# Patient Record
Sex: Female | Born: 1940 | Race: White | Hispanic: No | Smoking: Never smoker
Health system: Southern US, Community
[De-identification: ages and names within clinical notes are randomized; demographics above are authoritative.]

## PROBLEM LIST (undated history)

## (undated) DIAGNOSIS — I10 Essential (primary) hypertension: Secondary | ICD-10-CM

## (undated) DIAGNOSIS — E785 Hyperlipidemia, unspecified: Secondary | ICD-10-CM

## (undated) DIAGNOSIS — K219 Gastro-esophageal reflux disease without esophagitis: Secondary | ICD-10-CM

## (undated) DIAGNOSIS — R296 Repeated falls: Secondary | ICD-10-CM

## (undated) DIAGNOSIS — F32A Depression, unspecified: Secondary | ICD-10-CM

## (undated) DIAGNOSIS — E559 Vitamin D deficiency, unspecified: Secondary | ICD-10-CM

## (undated) DIAGNOSIS — F329 Major depressive disorder, single episode, unspecified: Secondary | ICD-10-CM

## (undated) DIAGNOSIS — W19XXXA Unspecified fall, initial encounter: Secondary | ICD-10-CM

## (undated) DIAGNOSIS — R413 Other amnesia: Secondary | ICD-10-CM

## (undated) DIAGNOSIS — E119 Type 2 diabetes mellitus without complications: Secondary | ICD-10-CM

## (undated) HISTORY — DX: Unspecified fall, initial encounter: W19.XXXA

## (undated) HISTORY — DX: Gastro-esophageal reflux disease without esophagitis: K21.9

## (undated) HISTORY — DX: Type 2 diabetes mellitus without complications: E11.9

## (undated) HISTORY — DX: Hyperlipidemia, unspecified: E78.5

## (undated) HISTORY — DX: Essential (primary) hypertension: I10

## (undated) HISTORY — DX: Vitamin D deficiency, unspecified: E55.9

## (undated) HISTORY — DX: Major depressive disorder, single episode, unspecified: F32.9

## (undated) HISTORY — DX: Depression, unspecified: F32.A

## (undated) HISTORY — DX: Repeated falls: R29.6

## (undated) HISTORY — PX: CHOLECYSTECTOMY: SHX55

## (undated) HISTORY — DX: Other amnesia: R41.3

---

## 1988-07-17 HISTORY — PX: TOTAL ABDOMINAL HYSTERECTOMY W/ BILATERAL SALPINGOOPHORECTOMY: SHX83

## 1997-11-27 ENCOUNTER — Other Ambulatory Visit: Admission: RE | Admit: 1997-11-27 | Discharge: 1997-11-27 | Payer: Self-pay | Admitting: *Deleted

## 1998-03-11 ENCOUNTER — Ambulatory Visit (HOSPITAL_COMMUNITY): Admission: RE | Admit: 1998-03-11 | Discharge: 1998-03-11 | Payer: Self-pay | Admitting: *Deleted

## 1998-04-20 ENCOUNTER — Encounter: Payer: Self-pay | Admitting: General Surgery

## 1998-04-20 ENCOUNTER — Ambulatory Visit (HOSPITAL_COMMUNITY): Admission: RE | Admit: 1998-04-20 | Discharge: 1998-04-21 | Payer: Self-pay | Admitting: General Surgery

## 1998-12-28 ENCOUNTER — Other Ambulatory Visit: Admission: RE | Admit: 1998-12-28 | Discharge: 1998-12-28 | Payer: Self-pay | Admitting: *Deleted

## 1999-04-06 ENCOUNTER — Ambulatory Visit (HOSPITAL_COMMUNITY): Admission: RE | Admit: 1999-04-06 | Discharge: 1999-04-06 | Payer: Self-pay | Admitting: *Deleted

## 2000-01-23 ENCOUNTER — Other Ambulatory Visit: Admission: RE | Admit: 2000-01-23 | Discharge: 2000-01-23 | Payer: Self-pay | Admitting: *Deleted

## 2000-06-26 ENCOUNTER — Other Ambulatory Visit: Admission: RE | Admit: 2000-06-26 | Discharge: 2000-06-26 | Payer: Self-pay | Admitting: Plastic Surgery

## 2000-06-26 ENCOUNTER — Encounter (INDEPENDENT_AMBULATORY_CARE_PROVIDER_SITE_OTHER): Payer: Self-pay

## 2002-03-06 ENCOUNTER — Encounter: Payer: Self-pay | Admitting: Internal Medicine

## 2002-03-06 ENCOUNTER — Ambulatory Visit (HOSPITAL_COMMUNITY): Admission: RE | Admit: 2002-03-06 | Discharge: 2002-03-06 | Payer: Self-pay | Admitting: Internal Medicine

## 2002-03-13 ENCOUNTER — Encounter: Payer: Self-pay | Admitting: Internal Medicine

## 2002-03-13 ENCOUNTER — Ambulatory Visit (HOSPITAL_COMMUNITY): Admission: RE | Admit: 2002-03-13 | Discharge: 2002-03-13 | Payer: Self-pay | Admitting: Internal Medicine

## 2002-11-13 ENCOUNTER — Ambulatory Visit (HOSPITAL_COMMUNITY): Admission: RE | Admit: 2002-11-13 | Discharge: 2002-11-13 | Payer: Self-pay | Admitting: Internal Medicine

## 2002-11-13 ENCOUNTER — Encounter: Payer: Self-pay | Admitting: Internal Medicine

## 2003-03-26 ENCOUNTER — Encounter: Admission: RE | Admit: 2003-03-26 | Discharge: 2003-06-24 | Payer: Self-pay | Admitting: Internal Medicine

## 2004-03-31 ENCOUNTER — Ambulatory Visit (HOSPITAL_COMMUNITY): Admission: RE | Admit: 2004-03-31 | Discharge: 2004-03-31 | Payer: Self-pay | Admitting: Internal Medicine

## 2005-04-04 ENCOUNTER — Other Ambulatory Visit: Admission: RE | Admit: 2005-04-04 | Discharge: 2005-04-04 | Payer: Self-pay | Admitting: Internal Medicine

## 2005-04-07 ENCOUNTER — Ambulatory Visit (HOSPITAL_COMMUNITY): Admission: RE | Admit: 2005-04-07 | Discharge: 2005-04-07 | Payer: Self-pay | Admitting: Internal Medicine

## 2005-11-15 ENCOUNTER — Encounter: Payer: Self-pay | Admitting: General Surgery

## 2006-04-11 ENCOUNTER — Ambulatory Visit (HOSPITAL_COMMUNITY): Admission: RE | Admit: 2006-04-11 | Discharge: 2006-04-11 | Payer: Self-pay | Admitting: Internal Medicine

## 2007-05-08 ENCOUNTER — Ambulatory Visit (HOSPITAL_COMMUNITY): Admission: RE | Admit: 2007-05-08 | Discharge: 2007-05-08 | Payer: Self-pay | Admitting: Internal Medicine

## 2008-06-09 ENCOUNTER — Ambulatory Visit (HOSPITAL_COMMUNITY): Admission: RE | Admit: 2008-06-09 | Discharge: 2008-06-09 | Payer: Self-pay | Admitting: Internal Medicine

## 2008-11-19 ENCOUNTER — Ambulatory Visit (HOSPITAL_COMMUNITY): Admission: RE | Admit: 2008-11-19 | Discharge: 2008-11-19 | Payer: Self-pay | Admitting: Internal Medicine

## 2009-01-04 ENCOUNTER — Inpatient Hospital Stay (HOSPITAL_COMMUNITY): Admission: EM | Admit: 2009-01-04 | Discharge: 2009-01-05 | Payer: Self-pay | Admitting: Emergency Medicine

## 2009-01-04 ENCOUNTER — Ambulatory Visit: Payer: Self-pay | Admitting: *Deleted

## 2009-03-29 ENCOUNTER — Encounter (INDEPENDENT_AMBULATORY_CARE_PROVIDER_SITE_OTHER): Payer: Self-pay | Admitting: *Deleted

## 2009-06-02 ENCOUNTER — Other Ambulatory Visit: Admission: RE | Admit: 2009-06-02 | Discharge: 2009-06-02 | Payer: Self-pay | Admitting: Internal Medicine

## 2009-06-02 LAB — HM PAP SMEAR: HM Pap smear: NORMAL

## 2009-06-16 ENCOUNTER — Ambulatory Visit (HOSPITAL_COMMUNITY): Admission: RE | Admit: 2009-06-16 | Discharge: 2009-06-16 | Payer: Self-pay | Admitting: Internal Medicine

## 2009-07-07 ENCOUNTER — Encounter (INDEPENDENT_AMBULATORY_CARE_PROVIDER_SITE_OTHER): Payer: Self-pay | Admitting: *Deleted

## 2009-07-12 ENCOUNTER — Ambulatory Visit: Payer: Self-pay | Admitting: Internal Medicine

## 2009-07-23 ENCOUNTER — Ambulatory Visit: Payer: Self-pay | Admitting: Internal Medicine

## 2009-07-23 LAB — HM COLONOSCOPY: HM Colonoscopy: NORMAL

## 2010-08-07 ENCOUNTER — Encounter: Payer: Self-pay | Admitting: Internal Medicine

## 2010-08-18 NOTE — Procedures (Signed)
Summary: Colonoscopy  Patient: Melissa Davis Note: All result statuses are Final unless otherwise noted.  Tests: (1) Colonoscopy (COL)   COL Colonoscopy           DONE     [facility]     [address]     [place], [place]  [address]           COLONOSCOPY PROCEDURE REPORT           PATIENT:  Melissa Davis, Melissa Davis  MR#:  [id number]     BIRTHDATE:  04-Oct-1940, 68 yrs. old  GENDER:  female           ENDOSCOPIST:  Hedwig Morton. Juanda Chance, MD     Referred by:  Marisue Brooklyn, D.O.           PROCEDURE DATE:  07/23/2009     PROCEDURE:  Colonoscopy 25956     ASA CLASS:  Class I     INDICATIONS:  Routine Risk Screening last colon 2000 was normal           MEDICATIONS:   Versed 7 mg, Fentanyl 50 mcg           DESCRIPTION OF PROCEDURE:   After the risks benefits and     alternatives of the procedure were thoroughly explained, informed     consent was obtained.  Digital rectal exam was performed and     revealed no rectal masses.   The LB CF-[id number] endoscope     was introduced through the anus and advanced to the cecum, which     was identified by both the appendix and ileocecal valve, without     limitations.  The quality of the prep was good, using MiraLax.     The instrument was then slowly withdrawn as the colon was fully     examined.     <<PROCEDUREIMAGES>>           FINDINGS:  No polyps or cancers were seen (see image1, image2,     image3, image4, and image5).   Retroflexed views in the rectum     revealed no abnormalities.    The scope was then withdrawn from     the patient and the procedure completed.           COMPLICATIONS:  None           ENDOSCOPIC IMPRESSION:     1) No polyps or cancers     2) Normal colonoscopy     RECOMMENDATIONS:     1) high fiber diet           REPEAT EXAM:  In 10 year(s) for.           ______________________________     Hedwig Morton. Juanda Chance, MD           CC:           n.     eSIGNED:   Hedwig Morton. Bekim Werntz at 07/23/2009 10:15 AM           Melissa Davis, [id number]  Note: An exclamation mark (!) indicates a result that was not dispersed into the flowsheet. Document Creation Date: 07/23/2009 10:16 AM _______________________________________________________________________  (1) Order result status: Final Collection or observation date-time: 07/23/2009 10:11 Requested date-time:  Receipt date-time:  Reported date-time:  Referring Physician:   Ordering Physician: Lina Sar [phone]) Specimen Source:  Source: Launa Grill Order Number: [id number] Lab site:   Appended Document: Colonoscopy    Clinical Lists Changes  Observations: Added new observation of COLONNXTDUE: 07/2019 (07/23/2009 13:07)

## 2010-10-02 LAB — GLUCOSE, CAPILLARY

## 2010-10-24 LAB — CBC
HCT: 42.2 % (ref 36.0–46.0)
Hemoglobin: 14.3 g/dL (ref 12.0–15.0)
MCHC: 33.9 g/dL (ref 30.0–36.0)
MCHC: 33.9 g/dL (ref 30.0–36.0)
MCV: 90.5 fL (ref 78.0–100.0)
Platelets: 201 10*3/uL (ref 150–400)
Platelets: 248 10*3/uL (ref 150–400)
RBC: 4.66 MIL/uL (ref 3.87–5.11)
RDW: 13 % (ref 11.5–15.5)
RDW: 13 % (ref 11.5–15.5)
WBC: 7.4 10*3/uL (ref 4.0–10.5)

## 2010-10-24 LAB — URINALYSIS, MICROSCOPIC ONLY
Bilirubin Urine: NEGATIVE
Ketones, ur: NEGATIVE mg/dL
Protein, ur: NEGATIVE mg/dL
Specific Gravity, Urine: 1.008 (ref 1.005–1.030)
Urobilinogen, UA: 0.2 mg/dL (ref 0.0–1.0)
pH: 7.5 (ref 5.0–8.0)

## 2010-10-24 LAB — DIFFERENTIAL
Basophils Absolute: 0 10*3/uL (ref 0.0–0.1)
Basophils Relative: 0 % (ref 0–1)
Eosinophils Absolute: 0.1 10*3/uL (ref 0.0–0.7)
Eosinophils Relative: 1 % (ref 0–5)
Lymphocytes Relative: 17 % (ref 12–46)
Monocytes Relative: 4 % (ref 3–12)

## 2010-10-24 LAB — LIPID PANEL
HDL: 42 mg/dL (ref >39)
LDL Cholesterol: 86 mg/dL (ref 0–99)
VLDL: 17 mg/dL (ref 0–40)

## 2010-10-24 LAB — BASIC METABOLIC PANEL
BUN: 11 mg/dL (ref 6–23)
BUN: 16 mg/dL (ref 6–23)
CO2: 24 mEq/L (ref 19–32)
Calcium: 8.4 mg/dL (ref 8.4–10.5)
Creatinine, Ser: 0.68 mg/dL (ref 0.4–1.2)
GFR calc non Af Amer: >60 mL/min (ref >60)
Glucose, Bld: 140 mg/dL — ABNORMAL HIGH (ref 70–99)
Potassium: 3.3 mEq/L — ABNORMAL LOW (ref 3.5–5.1)

## 2010-10-24 LAB — CARDIAC PANEL(CRET KIN+CKTOT+MB+TROPI)
CK, MB: 1.1 ng/mL (ref 0.3–4.0)
Relative Index: 1 (ref 0.0–2.5)
Relative Index: INVALID (ref 0.0–2.5)
Total CK: 115 U/L (ref 7–177)
Troponin I: 0.01 ng/mL (ref 0.00–0.06)
Troponin I: 0.01 ng/mL (ref 0.00–0.06)

## 2010-10-24 LAB — PROTIME-INR
INR: 1 (ref 0.00–1.49)
Prothrombin Time: 13.7 seconds (ref 11.6–15.2)

## 2010-10-24 LAB — GLUCOSE, CAPILLARY
Glucose-Capillary: 113 mg/dL — ABNORMAL HIGH (ref 70–99)
Glucose-Capillary: 121 mg/dL — ABNORMAL HIGH (ref 70–99)
Glucose-Capillary: 122 mg/dL — ABNORMAL HIGH (ref 70–99)

## 2010-10-24 LAB — POCT CARDIAC MARKERS: CKMB, poc: <1 ng/mL — ABNORMAL LOW (ref 1.0–8.0)

## 2010-10-24 LAB — HEMOGLOBIN A1C: Hgb A1c MFr Bld: 6.4 % — ABNORMAL HIGH (ref 4.6–6.1)

## 2010-10-24 LAB — HEPARIN LEVEL (UNFRACTIONATED): Heparin Unfractionated: 0.44 IU/mL (ref 0.30–0.70)

## 2010-11-29 NOTE — Cardiovascular Report (Signed)
Melissa Davis, Melissa Davis                  ACCOUNT NO.:  [id number]   MEDICAL RECORD NO.:  [id number]          PATIENT TYPE:  INP   LOCATION:  4734                         FACILITY:  [facility]   PHYSICIAN:  Corky Crafts, MDDATE OF BIRTH:  1940/10/04   DATE OF PROCEDURE:  01/04/2009  DATE OF DISCHARGE:                            CARDIAC CATHETERIZATION   PRIMARY CARE PHYSICIAN:  Lovenia Kim, DO   PROCEDURES PERFORMED:  Left heart catheterization, left ventriculogram,  coronary angiogram, abdominal aortogram.   OPERATOR:  Corky Crafts, MD   INDICATIONS:  Unstable angina, diabetes.   PROCEDURE NARRATIVE:  The risks and benefits of cardiac catheterization  were explained to the patient and informed consent was obtained.  She  was brought to the cath lab.  She was prepped and draped in the usual  sterile fashion.  Her right groin was infiltrated with 0.25% lidocaine.  A 6-French sheath was placed to the right femoral artery using modified  Seldinger technique.  Left coronary artery angiography was performed  using a JR-4.0 catheter.  The catheter was advanced to the vessel ostium  under fluoroscopic guidance.  Digital angiography was performed in  multiple projections using hand injection of contrast.  Right coronary  artery angiography was performed using a JR-4.0 catheter.  The catheter  was advanced to the vessel ostium under fluoroscopic guidance.  Digital  angiography was performed in multiple projections using hand injection  of contrast.  A pigtail catheter was advanced to the ascending aorta and  across the aortic valve under fluoroscopic guidance.  A power injection  of contrast was performed in the RAO projection to image the left  ventricle.  Catheter was pulled back under continuous hemodynamic  pressure monitoring to the abdominal aorta.  Power injection of contrast  was performed in the AP projection.  The catheter was removed.  A right  femoral arteriogram was  performed in the RAO and LAO views.  The sheath  will be removed using manual compression.   FINDINGS:  The left main is widely patent and angiographically normal.   Left circumflex is a large vessel and angiographically normal.  The OM-1  was small, the OM-2 was a large branching vessel and appeared  angiographically normal, OM-3 was small and widely patent.   The left anterior descending was a large vessel and angiographically  normal.  There is a small patent first diagonal.   The right coronary artery was a medium-sized dominant vessel with mild  irregularities in the proximal to mid vessel.  There was a medium-sized  PDA and PLA both of which were widely patent.   Left ventriculogram showed normal left ventricular ejection fraction of  60%.  There were no regional wall motion abnormalities.   HEMODYNAMICS:  Left ventricular pressure 127/6 with an LVEDP of 12 mmHg.  Aortic pressure of 126/56.  The mean aortic pressure of 86 mmHg.  The  abdominal aortogram showed no abdominal aortic aneurysm.  There were  bilateral single renal arteries both of which were widely patent.   The right femoral arteriogram showed question of a  small intimal  disruption at the entry site.  This could also be in a small area of  calcification.  There is normal flow through the femoral artery.  The  decision was made not to apply closure device.   IMPRESSION:  1. No significant coronary artery disease.  2. Normal left ventricular function.  3. Normal hemodynamics.   RECOMMENDATIONS:  Continue preventive therapy, discontinue heparin and  nitroglycerin, and we will watch her overnight and plan on sending her  home tomorrow.      Corky Crafts, MD  Electronically Signed     JSV/MEDQ  D:  01/04/2009  T:  01/05/2009  Job:  [id number]

## 2010-11-29 NOTE — Discharge Summary (Signed)
NAMESIREN, Melissa Davis                  ACCOUNT NO.:  [id number]   MEDICAL RECORD NO.:  [id number]          PATIENT TYPE:  INP   LOCATION:  4734                         FACILITY:  [facility]   PHYSICIAN:  Corky Crafts, MDDATE OF BIRTH:  1940/10/20   DATE OF ADMISSION:  01/04/2009  DATE OF DISCHARGE:  01/05/2009                               DISCHARGE SUMMARY   DISCHARGE DIAGNOSES:  1. Chest pain.  Cardiac catheterization showed an geographically      patent arteries with normal ejection fraction.  2. Hypertension.  3. Diabetes mellitus.  4. Hyperlipidemia.  5. Status post hysterectomy.  6. Status post cholecystectomy.  7. Long-term medication use.   [name] is a 70 year old female who wake from sleep around 3:00 p.m.  on the day of admission with substernal chest pain.  She said it hurts,  worsens when she takes breath.  In the emergency room, she got some  nitroglycerin, which seemed to help.  Her cardiac enzymes remained  negative; however, her story was very concerning for underlying heart  disease; therefore, she underwent a heart catheterization which showed  angiographically normal coronary arteries.  Her EF was 60% with no wall  motion abnormalities.  Now, abdominal aortogram showed no abdominal  aortic aneurysm, bilateral single renals that were widely patent.  There  was at the right femoral artery a questionable small intimal disruption  at the entry site, but the flow was normal.  Dr. Eldridge Dace then  recommended preventive treatment.  She remained in the hospital  overnight and was stable for discharge to home the following day.   On discharge, her sodium was 140, potassium 4.9, BUN 11, creatinine  0.68.  Initially, her hemoglobin was 14.3 with a hematocrit of 42.2.  As  I am dictating, her last CBC today showed a hemoglobin had dropped to  11.9 with a crit of 35.0.  We are rechecking this because it seems like  a big discrepancy and we are hopeful that this is lab  error.  I will not  let her go home unless I am convinced that her hemoglobin has not  dropped significantly.  Other lab work includes a cholesterol 145,  triglycerides 83, HDL 42, LDL 86.  Urinalysis negative.  Magnesium is  1.8.  Her chest x-ray shows mild cardiomegaly with no acute  cardiopulmonary disease.   DISCHARGE MEDICATIONS:  1. We added an aspirin 81 mg 1 p.o. daily.  2. Atenolol 100 mg daily.  3. Simvastatin 20 mg nightly.  4. Accupril 40 mg a day.  5. Amlodipine 10 mg a day.  6. Clorazepate as prior to admission.  7. Eyedrops as prior to admission.  8. She is to restart Januvia on January 07, 2009.   Remain on a low-sodium, heart-healthy, diabetic diet.  Clean cath site  gently with soap and water.  No scrubbing.  Increase activity slowly.  No lifting over 10 pounds for 1 week.  No driving for 2 days.   Follow up with Dr. Eldridge Dace on January 19, 2009, at 9:00 a.m.   She is discharged to home  in stable but improved condition.      Guy Franco, P.A.      Corky Crafts, MD  Electronically Signed    LB/MEDQ  D:  01/05/2009  T:  01/06/2009  Job:  [id number]   cc:   Lovenia Kim, D.O.

## 2010-11-29 NOTE — H&P (Signed)
Melissa Davis, Melissa Davis                  ACCOUNT NO.:  [id number]   MEDICAL RECORD NO.:  [id number]          PATIENT TYPE:  INP   LOCATION:  2314                         FACILITY:  [facility]   PHYSICIAN:  Unice Cobble, MD     DATE OF BIRTH:  14-Nov-1940   DATE OF ADMISSION:  01/04/2009  DATE OF DISCHARGE:                              HISTORY & PHYSICAL   CHIEF COMPLAINT:  Chest pain.   HISTORY OF PRESENT ILLNESS:  This is a 70 year old white female who  awoke from sleep at 3:00 p.m. with chest pain.  She describes the chest  pain is a pressure in her central chest without radiation.  There is no  diaphoresis, shortness of breath, nausea, presyncope, or palpitations.  It hurts worse when she takes a deep breath.  The pain was 5/10 when she  awoke.  She received three sublingual nitroglycerin in the emergency  department, as well as 1 inch of nitroglycerin paste which helped a bit  and now her pain is down to 2/10.  She denies orthopnea, PND, or lower  extremity edema.   PAST MEDICAL HISTORY:  1. Hypertension.  2. Diabetes.  3. Hyperlipidemia.  4. Status post hysterectomy.  5. Status post cholecystectomy.   ALLERGIES:  NO KNOWN DRUG ALLERGIES.   MEDICATIONS:  1. Accupril 40 mg daily.  2. Amlodipine 10 mg daily.  3. Atenolol 100 mg daily.  4. Clorazepate dipotassium 7.5 mg p.r.n.  5. Januvia 50 mg daily.  6. Simvastatin 20 mg nightly.  7. Travatan ophthalmic eye drops 1 drop each eye nightly.   SOCIAL HISTORY:  She lives alone.  She is retired.  She has never  smoked, drank or used drugs.   FAMILY HISTORY:  Her mother had a heart attack at the age of 75.  Her  father did not have coronary artery disease.   REVIEW OF SYSTEMS:  A complete review of systems done and found to be  otherwise negative except as stated in the HPI.   PHYSICAL EXAMINATION:  VITAL SIGNS:  Temperature is 98, pulse of 83,  respiratory rate 21, blood pressure 155/58 with O2 saturations of 98% on  2 liters.  GENERAL:  She is in no acute distress.  HEENT:  Shows PERRLA, EOMI, MMM.  Oropharynx without erythema or  exudates.  NECK:  Supple without lymphadenopathy, thyromegaly, bruits or  jugulovenous distention.  HEART:  Regular rate and rhythm with normal S1-S2.  No murmurs, gallops  or rubs.  Pulses 2+ and equal bilaterally without bruits.  LUNGS:  Clear to auscultation bilaterally.  ABDOMEN:  Soft and nontender with normal bowel sounds.  No rebound or  guarding.  EXTREMITIES:  Show no cyanosis, clubbing or edema.  MUSCULOSKELETAL:  Shows no joint deformity, effusions, spine or CVA  tenderness.  NEUROLOGICAL:  She is alert and oriented x3 with cranial nerves II-XII  grossly intact.  Strength is 5/5 in all extremities and axial groups  with normal sensation throughout.   LAB AND RADIOLOGY REVIEW:  Chest x-ray shows stable cardiomegaly without  cardiopulmonary disease.  EKG shows a  rate of 80 in normal sinus rhythm.  She has ST-segment depression of up to 1/2 mm inferolaterally.  Her  white count is 13.3 with a left shift.  Potassium is 3.3 with a  creatinine of 0.8, glucose is 164.  CK-MB and troponin are negative.   ASSESSMENT/PLAN:  This is a 70 year old white female with atypical chest  pain with multiple risk factors.  Her EKG is concerning inferolaterally,  although her troponin is negative.  I will bring her in for rule  out/unstable angina with serial troponins and ECGs.  She will receive a  stress test versus a catheterization based on the above results.  Her  home medications will be continued.  She does have an elevated white  blood cell count for unclear reasons.  I will check urinalysis as a  matter of course.      Unice Cobble, MD  Electronically Signed     ACJ/MEDQ  D:  01/04/2009  T:  01/04/2009  Job:  [id number]

## 2011-06-15 ENCOUNTER — Other Ambulatory Visit: Payer: Self-pay | Admitting: Internal Medicine

## 2011-06-15 DIAGNOSIS — N6310 Unspecified lump in the right breast, unspecified quadrant: Secondary | ICD-10-CM

## 2011-06-30 ENCOUNTER — Ambulatory Visit
Admission: RE | Admit: 2011-06-30 | Discharge: 2011-06-30 | Disposition: A | Discharge status: Home / Self Care | Payer: Medicare Other | Source: Ambulatory Visit | Attending: Internal Medicine | Admitting: Internal Medicine

## 2011-06-30 DIAGNOSIS — N6310 Unspecified lump in the right breast, unspecified quadrant: Secondary | ICD-10-CM

## 2013-05-28 ENCOUNTER — Ambulatory Visit (INDEPENDENT_AMBULATORY_CARE_PROVIDER_SITE_OTHER): Payer: Medicare Other | Admitting: Podiatry

## 2013-05-28 ENCOUNTER — Encounter: Payer: Self-pay | Admitting: Podiatry

## 2013-05-28 VITALS — BP 138/64 | HR 86 | Resp 12

## 2013-05-28 DIAGNOSIS — B351 Tinea unguium: Secondary | ICD-10-CM

## 2013-05-28 DIAGNOSIS — M79609 Pain in unspecified limb: Secondary | ICD-10-CM

## 2013-05-28 NOTE — Progress Notes (Signed)
Patient ID: CHIYO FAY, female   DOB: 1940/08/18, 71 y.o.   MRN: [id number]  Subjective: 72 year old white female presents for ongoing debridement of painful mycotic toenails at three-month intervals.  Objective: Hypertrophic elongated toenails with texture and color changes x10 with palpable tenderness all 10 nail plates.  Assessment: Symptomatic onychomycoses x10 Diabetes  Plan: All 10 toenails are debrided without any bleeding. Reappoint at three-month intervals.

## 2013-06-15 ENCOUNTER — Encounter: Payer: Self-pay | Admitting: Internal Medicine

## 2013-06-15 DIAGNOSIS — E785 Hyperlipidemia, unspecified: Secondary | ICD-10-CM | POA: Insufficient documentation

## 2013-06-15 DIAGNOSIS — I10 Essential (primary) hypertension: Secondary | ICD-10-CM | POA: Insufficient documentation

## 2013-06-15 DIAGNOSIS — K219 Gastro-esophageal reflux disease without esophagitis: Secondary | ICD-10-CM | POA: Insufficient documentation

## 2013-06-15 DIAGNOSIS — R7303 Prediabetes: Secondary | ICD-10-CM | POA: Insufficient documentation

## 2013-06-15 DIAGNOSIS — E559 Vitamin D deficiency, unspecified: Secondary | ICD-10-CM | POA: Insufficient documentation

## 2013-06-15 DIAGNOSIS — F325 Major depressive disorder, single episode, in full remission: Secondary | ICD-10-CM | POA: Insufficient documentation

## 2013-06-17 ENCOUNTER — Ambulatory Visit (HOSPITAL_COMMUNITY)
Admission: RE | Admit: 2013-06-17 | Discharge: 2013-06-17 | Disposition: A | Discharge status: Home / Self Care | Payer: Medicare Other | Source: Ambulatory Visit | Attending: Physician Assistant | Admitting: Physician Assistant

## 2013-06-17 ENCOUNTER — Encounter: Payer: Self-pay | Admitting: Physician Assistant

## 2013-06-17 ENCOUNTER — Ambulatory Visit (INDEPENDENT_AMBULATORY_CARE_PROVIDER_SITE_OTHER): Payer: Medicare Other | Admitting: Physician Assistant

## 2013-06-17 VITALS — BP 116/60 | HR 60 | Temp 97.7°F | Resp 16 | Ht 63.0 in | Wt 110.0 lb

## 2013-06-17 DIAGNOSIS — Z0000 Encounter for general adult medical examination without abnormal findings: Secondary | ICD-10-CM

## 2013-06-17 DIAGNOSIS — E119 Type 2 diabetes mellitus without complications: Secondary | ICD-10-CM | POA: Insufficient documentation

## 2013-06-17 DIAGNOSIS — I10 Essential (primary) hypertension: Secondary | ICD-10-CM | POA: Insufficient documentation

## 2013-06-17 DIAGNOSIS — E785 Hyperlipidemia, unspecified: Secondary | ICD-10-CM

## 2013-06-17 DIAGNOSIS — Z79899 Other long term (current) drug therapy: Secondary | ICD-10-CM

## 2013-06-17 DIAGNOSIS — N3000 Acute cystitis without hematuria: Secondary | ICD-10-CM

## 2013-06-17 DIAGNOSIS — Z9089 Acquired absence of other organs: Secondary | ICD-10-CM | POA: Insufficient documentation

## 2013-06-17 DIAGNOSIS — E559 Vitamin D deficiency, unspecified: Secondary | ICD-10-CM

## 2013-06-17 LAB — BASIC METABOLIC PANEL WITH GFR
CO2: 30 mEq/L (ref 19–32)
Calcium: 9.3 mg/dL (ref 8.4–10.5)
Creat: 0.58 mg/dL (ref 0.50–1.10)
GFR, Est African American: >89 mL/min
Glucose, Bld: 96 mg/dL (ref 70–99)
Sodium: 140 mEq/L (ref 135–145)

## 2013-06-17 LAB — LIPID PANEL
Cholesterol: 167 mg/dL (ref 0–200)
Triglycerides: 69 mg/dL (ref <150)

## 2013-06-17 LAB — CBC WITH DIFFERENTIAL/PLATELET
Basophils Relative: 1 % (ref 0–1)
Eosinophils Absolute: 0.1 10*3/uL (ref 0.0–0.7)
Eosinophils Relative: 1 % (ref 0–5)
Hemoglobin: 13.3 g/dL (ref 12.0–15.0)
MCH: 30.4 pg (ref 26.0–34.0)
MCHC: 34.1 g/dL (ref 30.0–36.0)
MCV: 89.2 fL (ref 78.0–100.0)
Monocytes Relative: 8 % (ref 3–12)
Neutrophils Relative %: 63 % (ref 43–77)
Platelets: 249 10*3/uL (ref 150–400)

## 2013-06-17 LAB — HEMOGLOBIN A1C
Hgb A1c MFr Bld: 6 % — ABNORMAL HIGH (ref <5.7)
Mean Plasma Glucose: 126 mg/dL — ABNORMAL HIGH (ref <117)

## 2013-06-17 LAB — MAGNESIUM: Magnesium: 1.9 mg/dL (ref 1.5–2.5)

## 2013-06-17 LAB — HEPATIC FUNCTION PANEL
ALT: 15 U/L (ref 0–35)
AST: 20 U/L (ref 0–37)
Albumin: 4.1 g/dL (ref 3.5–5.2)
Bilirubin, Direct: 0.2 mg/dL (ref 0.0–0.3)
Total Protein: 6.8 g/dL (ref 6.0–8.3)

## 2013-06-17 NOTE — Progress Notes (Signed)
Complete Physical HPI Patient presents for complete physical.   Patient's blood pressure has been controlled at home. Patient denies chest pain, shortness of breath, dizziness. She does states that she intermittently gets SOB with a hill near her house but sometimes she can walk the hill without any problems. She denies any accompaniments, never a smoker, and normal Cath in 2010.  Patient's cholesterol is diet controlled. They are on lipitor and denies myalgias. The patient's cholesterol last visit was LDL 108 The patient has been working on diet and exercise, walking daily if the weather is good and currently playing with 2 year,  for prediabetes, denies changes in vision, polys, and paresthesias. Last A1C in office was 6.0. Increasing stress, granddaughter and her two kids has moved in with her temporarily for another 1-2 months.  Current Medications:    Medication List   amLODipine 10 MG tablet  Commonly known as:  NORVASC     atenolol 100 MG tablet  Commonly known as:  TENORMIN     atorvastatin 40 MG tablet  Commonly known as:  LIPITOR  Take 40 mg by mouth daily. Patient takes 1/2 tablet every other day     clorazepate 7.5 MG tablet  Commonly known as:  TRANXENE  Take 7.5 mg by mouth 2 (two) times daily as needed for anxiety.     metFORMIN 500 MG 24 hr tablet  Commonly known as:  GLUCOPHAGE-XR     quinapril 40 MG tablet  Commonly known as:  ACCUPRIL     TRAVATAN Z 0.004 % Soln ophthalmic solution  Generic drug:  Travoprost (BAK Free)     VITAMIN D PO  Take 1,000 Int'l Units by mouth daily.       Health Maintenance:  Tetanus: 2013 Pneumovax: 2000 Flu vaccine: 05/01/2013 Zostavax: Pap: 2010 neg MGM: 06/2011 Due this year DEXA: 2012 due this year Colonoscopy: 2011 due 10 years  EGD: N/A  Allergies: No Known Allergies Medical History:  Past Medical History  Diagnosis Date  . Hyperlipidemia   . Hypertension   . Vitamin D deficiency   . GERD (gastroesophageal  reflux disease)   . Type II or unspecified type diabetes mellitus without mention of complication, not stated as uncontrolled   . Depression    Surgical History:  Past Surgical History  Procedure Laterality Date  . Cholecystectomy    . Total abdominal hysterectomy w/ bilateral salpingoophorectomy  1990   Family History:  Family History  Problem Relation Age of Onset  . Hypertension Mother   . Heart attack Mother   . Ulcers Mother   . CVA Father   . Alzheimer's disease Father   . Hypertension Father    Social History:  History   Social History  . Marital Status: Widowed    Spouse Name: N/A    Number of Children: N/A  . Years of Education: N/A   Occupational History  . Not on file.   Social History Main Topics  . Smoking status: Never Smoker   . Smokeless tobacco: Not on file  . Alcohol Use: No  . Drug Use: No  . Sexual Activity: Not on file   Other Topics Concern  . Not on file   Social History Narrative  . No narrative on file   ROS Constitutional: Denies weight loss/gain, headaches, insomnia, fatigue, night sweats, and change in appetite. Eyes: ( Dr. 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  ENT: Denies discharge, congestion, post  nasal drip, sore throat, earache, hearing loss, dental pain, Tinnitus, Vertigo, Sinus pain, snoring.  Cardio: Cath negative 2010 EF 60% Denies chest pain, palpitations, irregular heartbeat, diaphoresis, orthopnea, PND, claudication, edema Respiratory:+ SOB with hills denies cough,  pleurisy, hoarseness, wheezing.  Gastrointestinal: (Dr. Juanda Chance) Denies dysphagia, heartburn, pain, cramps, nausea, vomiting, bloating, diarrhea, constipation, hematemesis, melena, hematochezia, hemorrhoids Genitourinary: Denies dysuria, frequency, urgency, nocturia, hesitancy, discharge, hematuria, flank pain Breast:Denies Breast lumps, nipple discharge, bleeding.   Musculoskeletal: Denies arthralgia, myalgia, stiffness, Jt. Swelling, pain, Skin: Denies pruritis, rash, hives,  acne, eczema, changing in skin lesion Neuro: Denies Weakness, tremor, incoordination, spasms, paresthesia, pain Psychiatric: Denies confusion, memory loss, sensory loss Endocrine: Denies change in weight, skin, hair change, nocturia, and paresthesia, Diabetic Denies Polys, visual blurring, hyper /hypo glycemic episodes.  Heme/Lymph: Denies Excessive bleeding, bruising, enlarged lymph nodes  Physical Exam: Estimated body mass index is 19.49 kg/(m^2) as calculated from the following:   Height as of this encounter: 5\' 3"  (1.6 m).   Weight as of this encounter: 110 lb (49.896 kg). Filed Vitals:   06/17/13 0912  BP: 116/60  Pulse: 60  Temp: 97.7 F (36.5 C)  Resp: 16   General Appearance: Well nourished, in no apparent distress. Eyes: PERRLA, EOMs, conjunctiva no swelling or erythema, normal fundi and vessels. Sinuses: No Frontal/maxillary tenderness ENT/Mouth: Ext aud canals clear, normal light reflex with TMs without erythema, bulging.  Good dentition. No erythema, swelling, or exudate on post pharynx. Tonsils not swollen or erythematous. Hearing normal.  Neck: Supple, thyroid normal. No bruits Respiratory: Respiratory effort normal, BS equal bilaterally without rales, rhonchi, wheezing or stridor. Cardio: Heart sounds normal, regular rate and rhythm without murmurs, rubs or gallops. Peripheral pulses brisk and equal bilaterally, without edema.  Chest: symmetric, with normal excursions and percussion. Breasts: Symmetric, without lumps, nipple discharge, retractions, or fibrocystic changes.  Abdomen: Flat, soft, with bowl sounds. Non tender, no guarding, rebound, hernias, masses, or organomegaly. .  Lymphatics: Non tender without lymphadenopathy.  Genitourinary: defer Musculoskeletal: Full ROM all peripheral extremities,5/5 strength, and normal gait. Skin: Warm, dry without  rashes, lesions, ecchymosis.  Neuro: Cranial nerves intact, reflexes equal bilaterally. Normal muscle tone, no cerebellar symptoms. Sensation intact.  Psych: Awake and oriented X 3, normal affect, Insight and Judgment appropriate.   EKG: WNL no changes.  Assessment and Plan: Hyperlipidemia- check lipid panel, continue lipitor.   Hypertension - continue to monitor at home and medications.   Vitamin D deficiency- has not been taking vitamin D regularly and wishes to have not have it checked at this time, will get back on it.   GERD (gastroesophageal reflux disease)- ? GERD induced asthma- get on OTC PPI or zantac  Dyspnea with exertion- normal cath 2010, no accompaniments, nonsmoker- likely from GERD but will get CXR.   Type II Diabetes- exercise and diet- check A1C  Depression- controlled  MGM Due this year DEXA due this year Will consider Zostavax  Quentin Mulling 9:27 AM

## 2013-06-17 NOTE — Patient Instructions (Signed)

## 2013-06-18 LAB — URINALYSIS, MICROSCOPIC ONLY
Casts: NONE SEEN
Crystals: NONE SEEN
Squamous Epithelial / LPF: NONE SEEN

## 2013-06-18 LAB — URINALYSIS, ROUTINE W REFLEX MICROSCOPIC
Ketones, ur: NEGATIVE mg/dL
Nitrite: NEGATIVE
Specific Gravity, Urine: 1.005 (ref 1.005–1.030)
Urobilinogen, UA: 0.2 mg/dL (ref 0.0–1.0)
pH: 6.5 (ref 5.0–8.0)

## 2013-06-18 LAB — MICROALBUMIN / CREATININE URINE RATIO
Creatinine, Urine: 12.8 mg/dL
Microalb Creat Ratio: 39.1 mg/g — ABNORMAL HIGH (ref 0.0–30.0)

## 2013-07-29 ENCOUNTER — Encounter (HOSPITAL_COMMUNITY): Payer: Self-pay | Admitting: Emergency Medicine

## 2013-07-29 ENCOUNTER — Emergency Department (HOSPITAL_COMMUNITY)
Admission: EM | Admit: 2013-07-29 | Discharge: 2013-07-29 | Disposition: A | Discharge status: Home / Self Care | Payer: Medicare Other | Attending: Emergency Medicine | Admitting: Emergency Medicine

## 2013-07-29 ENCOUNTER — Emergency Department (HOSPITAL_COMMUNITY): Payer: Medicare Other

## 2013-07-29 DIAGNOSIS — Y9302 Activity, running: Secondary | ICD-10-CM | POA: Insufficient documentation

## 2013-07-29 DIAGNOSIS — S0081XA Abrasion of other part of head, initial encounter: Secondary | ICD-10-CM

## 2013-07-29 DIAGNOSIS — E559 Vitamin D deficiency, unspecified: Secondary | ICD-10-CM | POA: Insufficient documentation

## 2013-07-29 DIAGNOSIS — S0003XA Contusion of scalp, initial encounter: Secondary | ICD-10-CM | POA: Insufficient documentation

## 2013-07-29 DIAGNOSIS — Z79899 Other long term (current) drug therapy: Secondary | ICD-10-CM | POA: Insufficient documentation

## 2013-07-29 DIAGNOSIS — W010XXA Fall on same level from slipping, tripping and stumbling without subsequent striking against object, initial encounter: Secondary | ICD-10-CM | POA: Insufficient documentation

## 2013-07-29 DIAGNOSIS — S0990XA Unspecified injury of head, initial encounter: Secondary | ICD-10-CM

## 2013-07-29 DIAGNOSIS — S199XXA Unspecified injury of neck, initial encounter: Principal | ICD-10-CM

## 2013-07-29 DIAGNOSIS — Y929 Unspecified place or not applicable: Secondary | ICD-10-CM | POA: Insufficient documentation

## 2013-07-29 DIAGNOSIS — E785 Hyperlipidemia, unspecified: Secondary | ICD-10-CM | POA: Insufficient documentation

## 2013-07-29 DIAGNOSIS — S0083XA Contusion of other part of head, initial encounter: Secondary | ICD-10-CM

## 2013-07-29 DIAGNOSIS — F329 Major depressive disorder, single episode, unspecified: Secondary | ICD-10-CM | POA: Insufficient documentation

## 2013-07-29 DIAGNOSIS — E119 Type 2 diabetes mellitus without complications: Secondary | ICD-10-CM | POA: Insufficient documentation

## 2013-07-29 DIAGNOSIS — IMO0002 Reserved for concepts with insufficient information to code with codable children: Secondary | ICD-10-CM | POA: Insufficient documentation

## 2013-07-29 DIAGNOSIS — I10 Essential (primary) hypertension: Secondary | ICD-10-CM | POA: Insufficient documentation

## 2013-07-29 DIAGNOSIS — F3289 Other specified depressive episodes: Secondary | ICD-10-CM | POA: Insufficient documentation

## 2013-07-29 DIAGNOSIS — K219 Gastro-esophageal reflux disease without esophagitis: Secondary | ICD-10-CM | POA: Insufficient documentation

## 2013-07-29 DIAGNOSIS — S1093XA Contusion of unspecified part of neck, initial encounter: Secondary | ICD-10-CM

## 2013-07-29 DIAGNOSIS — S0993XA Unspecified injury of face, initial encounter: Secondary | ICD-10-CM | POA: Insufficient documentation

## 2013-07-29 NOTE — ED Notes (Signed)
Wound cleansed with dermal wound cleaner. Gravel removed.

## 2013-07-29 NOTE — ED Notes (Signed)
Abrasions cleaned and bacitracin placed on wounds.

## 2013-07-29 NOTE — Discharge Instructions (Signed)
Tylenol and motrin for pain. Return here as needed. Ice to the face and head. Follow up with your Primary Care Doctor.

## 2013-07-29 NOTE — ED Notes (Signed)
Pt was running and fell over curb. Pt's face and head hit cement. Pt denies losing consciousness.

## 2013-07-29 NOTE — ED Provider Notes (Signed)
Medical screening examination/treatment/procedure(s) were performed by non-physician practitioner and as supervising physician I was immediately available for consultation/collaboration.  EKG Interpretation   None         William Tylea Hise, MD 07/29/13 1530 

## 2013-07-29 NOTE — ED Provider Notes (Signed)
CSN: [id number]     Arrival date & time 07/29/13  1249 History  This chart was scribed for non-physician practitioner working with Dagmar HaitWilliam Blair Walden, MD by Ashley JacobsBrittany Andrews, ED scribe. This patient was seen in room TR07C/TR07C and the patient's care was started at 1:05 PM.   First MD Initiated Contact with Patient 07/29/13 1305     Chief Complaint  Patient presents with  . Fall   (Consider location/radiation/quality/duration/timing/severity/associated sxs/prior Treatment) Patient is a 73 y.o. female presenting with fall. The history is provided by the patient and medical records. No language interpreter was used.  Fall Pertinent negatives include no chest pain and no shortness of breath.   HPI Comments: Melissa Davis is a 73 y.o. female who presents to the Emergency Department complaining of fall that occurred today that caused a wound to the left side of her nose and an abrasion to her left knee.  She mentions while running she tripped over a curb and she feel to the ground. Pt denies LOC. The bleeding is controlled. She denies nausea, vomiting and visual disturbances. She also denies chest pain, hip, leg, and neck pain. Pt is unsure if her tetanus shot is UTD. She denies being on any blood thinners. She has a past medical hx of hyperlipidemia, HTN, and type II DM. Denies heart problems.  Past Medical History  Diagnosis Date  . Hyperlipidemia   . Hypertension   . Vitamin D deficiency   . GERD (gastroesophageal reflux disease)   . Type II or unspecified type diabetes mellitus without mention of complication, not stated as uncontrolled   . Depression    Past Surgical History  Procedure Laterality Date  . Cholecystectomy    . Total abdominal hysterectomy w/ bilateral salpingoophorectomy  1990   Family History  Problem Relation Age of Onset  . Hypertension Mother   . Heart attack Mother   . Ulcers Mother   . CVA Father   . Alzheimer's disease Father   . Hypertension Father     History  Substance Use Topics  . Smoking status: Never Smoker   . Smokeless tobacco: Not on file  . Alcohol Use: No   OB History   Grav Para Term Preterm Abortions TAB SAB Ect Mult Living                 Review of Systems  Eyes: Negative for visual disturbance.  Respiratory: Negative for shortness of breath.   Cardiovascular: Negative for chest pain.  Gastrointestinal: Negative for nausea and vomiting.  Musculoskeletal: Negative for arthralgias, back pain, myalgias and neck pain.  Skin: Positive for wound.  Neurological: Negative for syncope.  Hematological: Does not bruise/bleed easily.  All other systems reviewed and are negative.    Allergies  Review of patient's allergies indicates no known allergies.  Home Medications   Current Outpatient Rx  Name  Route  Sig  Dispense  Refill  . amLODipine (NORVASC) 10 MG tablet               . atenolol (TENORMIN) 100 MG tablet               . atorvastatin (LIPITOR) 40 MG tablet   Oral   Take 40 mg by mouth daily. Patient takes 1/2 tablet every other day         . Cholecalciferol (VITAMIN D PO)   Oral   Take 1,000 Int'l Units by mouth daily.         . clorazepate (  TRANXENE) 7.5 MG tablet   Oral   Take 7.5 mg by mouth 2 (two) times daily as needed for anxiety.         . metFORMIN (GLUCOPHAGE-XR) 500 MG 24 hr tablet               . quinapril (ACCUPRIL) 40 MG tablet               . TRAVATAN Z 0.004 % SOLN ophthalmic solution                BP 149/61  Pulse 58  Temp(Src) 97.9 F (36.6 C) (Oral)  Resp 16  Ht 5\' 4"  (1.626 m)  Wt 111 lb 4.8 oz (50.485 kg)  BMI 19.10 kg/m2  SpO2 100% Physical Exam  Nursing note and vitals reviewed. Constitutional: She is oriented to person, place, and time. She appears well-developed and well-nourished. No distress.  HENT:  Head: Normocephalic. Head is with abrasion and with contusion. Head is without raccoon's eyes and without laceration.    Eyes: EOM  are normal. Pupils are equal, round, and reactive to light.  Neck: Normal range of motion. Neck supple. No tracheal deviation present.  Cardiovascular: Normal rate.   Pulmonary/Chest: Effort normal. No respiratory distress.  Abdominal: Soft. She exhibits no distension.  Musculoskeletal: Normal range of motion.  Neurological: She is alert and oriented to person, place, and time.  Skin: Skin is warm and dry.  Psychiatric: She has a normal mood and affect. Her behavior is normal.    ED Course  Procedures (including critical care time) DIAGNOSTIC STUDIES: Oxygen Saturation is 100% on room air, normal by my interpretation.    COORDINATION OF CARE:  1:09 PM Discussed course of care with pt which includes cervical spine,head and maxillofacial CT. Pt understands and agrees.    The patient will be asked to follow up with her PCP. Told to return here as needed. Ice to her face   Carlyle Dolly, PA-C 07/29/13 1405

## 2013-07-29 NOTE — ED Notes (Signed)
PT reports tripped over curb this am and hit face. Pt has abrasions to nose and left side of face. No LOC, no blood thinners.

## 2013-08-27 ENCOUNTER — Encounter: Payer: Self-pay | Admitting: Podiatry

## 2013-08-27 ENCOUNTER — Ambulatory Visit (INDEPENDENT_AMBULATORY_CARE_PROVIDER_SITE_OTHER): Payer: Medicare Other | Admitting: Podiatry

## 2013-08-27 VITALS — BP 135/65 | HR 60 | Resp 12 | Ht 64.0 in | Wt 110.0 lb

## 2013-08-27 DIAGNOSIS — M79609 Pain in unspecified limb: Secondary | ICD-10-CM

## 2013-08-27 DIAGNOSIS — B351 Tinea unguium: Secondary | ICD-10-CM

## 2013-08-28 NOTE — Progress Notes (Signed)
Patient ID: Melissa Davis, female   DOB: July 30, 1940, 73 y.o.   MRN: [id number]  Subjective: 73 year old white female presents for ongoing debridement of painful mycotic toenails at three-month intervals.   Objective: Hypertrophic elongated toenails with texture and color changes x10 with palpable tenderness all 10 nail plates.   Assessment: Symptomatic onychomycoses x10  a hyperkeratotic lesion noted on the second right toe.  Diabetes   Plan: All 10 toenails and keratoses debrided back without any bleeding. Reappoint at three-month intervals.

## 2013-09-22 ENCOUNTER — Other Ambulatory Visit: Payer: Self-pay | Admitting: Internal Medicine

## 2013-09-26 ENCOUNTER — Encounter: Payer: Self-pay | Admitting: Internal Medicine

## 2013-09-26 ENCOUNTER — Ambulatory Visit (INDEPENDENT_AMBULATORY_CARE_PROVIDER_SITE_OTHER): Payer: Medicare Other | Admitting: Internal Medicine

## 2013-09-26 VITALS — BP 124/66 | HR 60 | Temp 97.7°F | Resp 16 | Ht 63.0 in | Wt 109.2 lb

## 2013-09-26 DIAGNOSIS — I10 Essential (primary) hypertension: Secondary | ICD-10-CM

## 2013-09-26 DIAGNOSIS — Z79899 Other long term (current) drug therapy: Secondary | ICD-10-CM

## 2013-09-26 DIAGNOSIS — E785 Hyperlipidemia, unspecified: Secondary | ICD-10-CM

## 2013-09-26 DIAGNOSIS — E119 Type 2 diabetes mellitus without complications: Secondary | ICD-10-CM

## 2013-09-26 DIAGNOSIS — E559 Vitamin D deficiency, unspecified: Secondary | ICD-10-CM

## 2013-09-26 DIAGNOSIS — H409 Unspecified glaucoma: Secondary | ICD-10-CM | POA: Insufficient documentation

## 2013-09-26 LAB — CBC WITH DIFFERENTIAL/PLATELET
BASOS ABS: 0.1 10*3/uL (ref 0.0–0.1)
BASOS PCT: 1 % (ref 0–1)
Eosinophils Absolute: 0.1 10*3/uL (ref 0.0–0.7)
Eosinophils Relative: 1 % (ref 0–5)
HCT: 40.9 % (ref 36.0–46.0)
Hemoglobin: 13.7 g/dL (ref 12.0–15.0)
Lymphocytes Relative: 26 % (ref 12–46)
Lymphs Abs: 1.8 10*3/uL (ref 0.7–4.0)
MCH: 29.7 pg (ref 26.0–34.0)
MCHC: 33.5 g/dL (ref 30.0–36.0)
MCV: 88.7 fL (ref 78.0–100.0)
Monocytes Absolute: 0.5 10*3/uL (ref 0.1–1.0)
Monocytes Relative: 7 % (ref 3–12)
NEUTROS ABS: 4.4 10*3/uL (ref 1.7–7.7)
NEUTROS PCT: 65 % (ref 43–77)
Platelets: 286 10*3/uL (ref 150–400)
RBC: 4.61 MIL/uL (ref 3.87–5.11)
RDW: 13.2 % (ref 11.5–15.5)
WBC: 6.8 10*3/uL (ref 4.0–10.5)

## 2013-09-26 LAB — HEMOGLOBIN A1C
Hgb A1c MFr Bld: 5.8 % — ABNORMAL HIGH (ref <5.7)
MEAN PLASMA GLUCOSE: 120 mg/dL — AB (ref <117)

## 2013-09-26 LAB — TSH: TSH: 0.841 u[IU]/mL (ref 0.350–4.500)

## 2013-09-26 NOTE — Patient Instructions (Signed)

## 2013-09-26 NOTE — Progress Notes (Signed)
Patient ID: Melissa Davis, female   DOB: June 22, 1941, 73 y.o.   MRN: [id number]    This very nice 73 y.o. WWF presents for 3 month follow up with Hypertension, Hyperlipidemia, T2 NIDDM and Vitamin D Deficiency.    HTN predates since the early 1990's. She does not ,monitor BP's at home. Today's BP: 124/66 mmHg . Patient denies any cardiac type chest pain, palpitations, dyspnea/orthopnea/PND, dizziness, claudication, or dependent edema.   Hyperlipidemia is controlled with diet & meds. Last Cholesterol was 167, Triglycerides were 69, HDL 58  and LDL 94 in Dec 2014 - at goal. Patient denies myalgias or other med SE's.    Also, the patient has history of T2 NIDDM since 2004 and is not monitoring at home and last A1c was 6.0% in Dec 2014. Patient denies any symptoms of reactive hypoglycemia, diabetic polys, paresthesias or visual blurring.   Further, Patient has history of Vitamin D Deficiency with last vitamin D of 37 in Oct 2014. Patient supplements vitamin D without any suspected side-effects.  Medication Sig Dispense Refill  . amLODipine (NORVASC) 10 MG tablet Take 10 mg by mouth daily.       Marland Kitchen. atenolol (TENORMIN) 100 MG tablet TAKE 1 TABLET EVERY DAY  90 tablet  4  . Cholecalciferol (VITAMIN D PO) Take 1,000 Int'l Units by mouth daily.      . metFORMIN (GLUCOPHAGE-XR) 500 MG 24 hr tablet Take 500 mg by mouth daily with breakfast.       . quinapril (ACCUPRIL) 40 MG tablet Take 40 mg by mouth daily.       . TRAVATAN Z 0.004 % SOLN ophthalmic solution Place 1 drop into both eyes at bedtime.          Allergies  Allergen Reactions  . Buspirone   . Clonidine Derivatives   . Cymbalta [Duloxetine Hcl]   . Elavil [Amitriptyline]   . Hytrin [Terazosin]   . Nizofenone   . Paxil [Paroxetine Hcl]   . Prozac [Fluoxetine Hcl]   . Zoloft [Sertraline Hcl]     PMHx:   Past Medical History  Diagnosis Date  . Hyperlipidemia   . Hypertension   . Vitamin D deficiency   . GERD (gastroesophageal reflux  disease)   . Type II or unspecified type diabetes mellitus without mention of complication, not stated as uncontrolled   . Depression    FHx:    Reviewed / unchanged  SHx:    Reviewed / unchanged   Systems Review:  Constitutional: Denies fever, chills, wt changes, headaches, insomnia, fatigue, night sweats, change in appetite. Eyes: Denies redness, blurred vision, diplopia, discharge, itchy, watery eyes.  ENT: Denies discharge, congestion, post nasal drip, epistaxis, sore throat, earache, hearing loss, dental pain, tinnitus, vertigo, sinus pain, snoring.  CV: Denies chest pain, palpitations, irregular heartbeat, syncope, dyspnea, diaphoresis, orthopnea, PND, claudication, edema. Respiratory: denies cough, dyspnea, DOE, pleurisy, hoarseness, laryngitis, wheezing.  Gastrointestinal: Denies dysphagia, odynophagia, heartburn, reflux, water brash, abdominal pain or cramps, nausea, vomiting, bloating, diarrhea, constipation, hematemesis, melena, hematochezia,  or hemorrhoids. Genitourinary: Denies dysuria, frequency, urgency, nocturia, hesitancy, discharge, hematuria, flank pain. Musculoskeletal: Denies arthralgias, myalgias, stiffness, jt. swelling, pain, limp, strain/sprain.  Skin: Denies pruritus, rash, hives, warts, acne, eczema, change in skin lesion(s). Neuro: No weakness, tremor, incoordination, spasms, paresthesia, or pain. Psychiatric: Denies confusion, memory loss, or sensory loss. Endo: Denies change in weight, skin, hair change.  Heme/Lymph: No excessive bleeding, bruising, orenlarged lymph nodes.   Exam:  BP 124/66  Pulse 60  Temp(Src) 97.7 F (36.5 C) (Temporal)  Resp 16  Ht 5\' 3"  (1.6 m)  Wt 109 lb 3.2 oz (49.533 kg)  BMI 19.35 kg/m2  Appears well nourished - in no distress. Eyes: PERRLA, EOMs, conjunctiva no swelling or erythema. Sinuses: No frontal/maxillary tenderness ENT/Mouth: EAC's clear, TM's nl w/o erythema, bulging. Nares clear w/o erythema, swelling,  exudates. Oropharynx clear without erythema or exudates. Oral hygiene is good. Tongue normal, non obstructing. Hearing intact.  Neck: Supple. Thyroid nl. Car 2+/2+ without bruits, nodes or JVD. Chest: Respirations nl with BS clear & equal w/o rales, rhonchi, wheezing or stridor.  Cor: Heart sounds normal w/ regular rate and rhythm without sig. murmurs, gallops, clicks, or rubs. Peripheral pulses normal and equal  without edema.  Abdomen: Soft & bowel sounds normal. Non-tender w/o guarding, rebound, hernias, masses, or organomegaly.  Lymphatics: Unremarkable.  Musculoskeletal: Full ROM all peripheral extremities, joint stability, 5/5 strength, and normal gait.  Skin: Warm, dry without exposed rashes, lesions, ecchymosis apparent.  Neuro: Cranial nerves intact, reflexes equal bilaterally. Sensory-motor testing grossly intact. Tendon reflexes grossly intact.  Pysch: Alert & oriented x 3. Insight and judgement nl & appropriate. No ideations.  Assessment and Plan:  1. Hypertension - Continue monitor blood pressure at home. Continue diet/meds same.  2. Hyperlipidemia - Continue diet/meds, exercise,& lifestyle modifications. Continue monitor periodic cholesterol/liver & renal functions   3. DT2 NIDDM - continue recommend prudent low glycemic diet, weight control, regular exercise, diabetic monitoring and periodic eye exams.  4. Vitamin D Deficiency - Continue supplementation.  Recommended regular exercise, BP monitoring, weight control, and discussed med and SE's. Recommended labs to assess and monitor clinical status. Further disposition pending results of labs.

## 2013-09-27 ENCOUNTER — Encounter: Payer: Self-pay | Admitting: Internal Medicine

## 2013-09-27 LAB — HEPATIC FUNCTION PANEL
ALT: 21 U/L (ref 0–35)
AST: 20 U/L (ref 0–37)
Albumin: 4.2 g/dL (ref 3.5–5.2)
Alkaline Phosphatase: 66 U/L (ref 39–117)
BILIRUBIN DIRECT: 0.2 mg/dL (ref 0.0–0.3)
BILIRUBIN INDIRECT: 0.7 mg/dL (ref 0.2–1.2)
TOTAL PROTEIN: 6.8 g/dL (ref 6.0–8.3)
Total Bilirubin: 0.9 mg/dL (ref 0.2–1.2)

## 2013-09-27 LAB — LIPID PANEL
CHOLESTEROL: 158 mg/dL (ref 0–200)
HDL: 61 mg/dL (ref >39)
LDL Cholesterol: 77 mg/dL (ref 0–99)
Total CHOL/HDL Ratio: 2.6 Ratio
Triglycerides: 102 mg/dL (ref <150)
VLDL: 20 mg/dL (ref 0–40)

## 2013-09-27 LAB — BASIC METABOLIC PANEL WITH GFR
BUN: 18 mg/dL (ref 6–23)
CO2: 26 mEq/L (ref 19–32)
Calcium: 9.2 mg/dL (ref 8.4–10.5)
Chloride: 103 mEq/L (ref 96–112)
Creat: 0.62 mg/dL (ref 0.50–1.10)
Glucose, Bld: 79 mg/dL (ref 70–99)
Potassium: 4.1 mEq/L (ref 3.5–5.3)
SODIUM: 142 meq/L (ref 135–145)

## 2013-09-27 LAB — MAGNESIUM: Magnesium: 1.8 mg/dL (ref 1.5–2.5)

## 2013-09-27 LAB — VITAMIN D 25 HYDROXY (VIT D DEFICIENCY, FRACTURES): VIT D 25 HYDROXY: 30 ng/mL (ref 30–89)

## 2013-09-27 LAB — INSULIN, FASTING: Insulin fasting, serum: 5 u[IU]/mL (ref 3–28)

## 2013-10-13 ENCOUNTER — Other Ambulatory Visit: Payer: Self-pay | Admitting: Internal Medicine

## 2013-10-13 MED ORDER — AMLODIPINE BESYLATE 10 MG PO TABS: 10.0000 mg | ORAL_TABLET | Freq: Every day | ORAL | Status: DC

## 2013-10-13 MED ORDER — METFORMIN HCL ER 500 MG PO TB24: 500.0000 mg | ORAL_TABLET | Freq: Every day | ORAL | Status: DC

## 2013-10-17 ENCOUNTER — Emergency Department (INDEPENDENT_AMBULATORY_CARE_PROVIDER_SITE_OTHER)
Admission: EM | Admit: 2013-10-17 | Discharge: 2013-10-17 | Disposition: A | Discharge status: Home / Self Care | Payer: Medicare Other | Source: Home / Self Care | Attending: Emergency Medicine | Admitting: Emergency Medicine

## 2013-10-17 ENCOUNTER — Encounter (HOSPITAL_COMMUNITY): Payer: Self-pay | Admitting: Emergency Medicine

## 2013-10-17 ENCOUNTER — Emergency Department (INDEPENDENT_AMBULATORY_CARE_PROVIDER_SITE_OTHER): Payer: Medicare Other

## 2013-10-17 DIAGNOSIS — S20219A Contusion of unspecified front wall of thorax, initial encounter: Secondary | ICD-10-CM

## 2013-10-17 DIAGNOSIS — IMO0002 Reserved for concepts with insufficient information to code with codable children: Secondary | ICD-10-CM

## 2013-10-17 DIAGNOSIS — S60519A Abrasion of unspecified hand, initial encounter: Secondary | ICD-10-CM

## 2013-10-17 DIAGNOSIS — S1093XA Contusion of unspecified part of neck, initial encounter: Secondary | ICD-10-CM

## 2013-10-17 DIAGNOSIS — S0012XA Contusion of left eyelid and periocular area, initial encounter: Secondary | ICD-10-CM

## 2013-10-17 DIAGNOSIS — S0003XA Contusion of scalp, initial encounter: Secondary | ICD-10-CM

## 2013-10-17 DIAGNOSIS — S0083XA Contusion of other part of head, initial encounter: Secondary | ICD-10-CM

## 2013-10-17 DIAGNOSIS — S20212A Contusion of left front wall of thorax, initial encounter: Secondary | ICD-10-CM

## 2013-10-17 NOTE — ED Notes (Signed)
Noted 2013 in PCP note

## 2013-10-17 NOTE — ED Notes (Addendum)
States ~ 4 pm today, she tripped on an irregularity on the side walk, landing on her face, chest, knees, both hands. Concerned about her injury to left orbit area, swelling noted . C/o hurts to breath, but might be her allergies; denies pain prior to fall. Denies LOC. Has abrasions to palmar aspect both hands, abrasion left forearm, states she has already taken care of injuries to both knees. superficial abrasions to both knees noted on inspection (ambulated freely to exam room) denies other injuries. NAD . [facility]

## 2013-10-17 NOTE — ED Provider Notes (Signed)
Medical screening examination/treatment/procedure(s) were performed by a resident physician and as supervising physician I was immediately available for consultation/collaboration.  Leslee Homeavid Flordia Kassem, M.D.  Reuben Likesavid C Cristin Penaflor, MD 10/17/13 2137

## 2013-10-17 NOTE — Discharge Instructions (Signed)
Ms. Arlyce HarmanBusby,   I am sorry that you had such a fall. I am glad that there is no evidence of serious injury. Please take ibuprofen for the pain and keep the wounds covered with bandaged and neosporin.   Follow up with your regular doctor next week as needed.   Take Care,   Dr. Clinton SawyerWilliamson

## 2013-10-17 NOTE — ED Provider Notes (Signed)
CSN: [id number]     Arrival date & time 10/17/13  1635 History   First MD Initiated Contact with Patient 10/17/13 1705     Chief Complaint  Patient presents with  . Fall   (Consider location/radiation/quality/duration/timing/severity/associated sxs/prior Treatment) HPI  73 year old patient who fell outside of her home approximately 2 hours ago. She noted that she was walking on the sidewalk and called her toe on an uneven piece of concrete. She fell forward landing on her knees with her arms outstretched and subsequently struck her left eye and left chest wall on the concrete as well. She did not lose consciousness. She was able to get up on her own. The fall was not preceded by "blacking out", chest pain, palpitations, or lightheadedness. She remembers the entire incident. She denies any pain right now aside from left anterior chest wall pain when she takes a deep breath. She has not been eating for pain. She has not cleaned the abrasions on her hands and knees yet. She denies any changes in vision.   Past Medical History  Diagnosis Date  . Hyperlipidemia   . Hypertension   . Vitamin D deficiency   . GERD (gastroesophageal reflux disease)   . Type II or unspecified type diabetes mellitus without mention of complication, not stated as uncontrolled   . Depression    Past Surgical History  Procedure Laterality Date  . Cholecystectomy    . Total abdominal hysterectomy w/ bilateral salpingoophorectomy  1990   Family History  Problem Relation Age of Onset  . Hypertension Mother   . Heart attack Mother   . Ulcers Mother   . CVA Father   . Alzheimer's disease Father   . Hypertension Father    History  Substance Use Topics  . Smoking status: Never Smoker   . Smokeless tobacco: Not on file  . Alcohol Use: No   OB History   Grav Para Term Preterm Abortions TAB SAB Ect Mult Living                 Review of Systems Negative for headache, visual changes, dizziness, fatigue, nausea,  vomiting Allergies  Buspirone; Clonidine derivatives; Cymbalta; Elavil; Hytrin; Nizofenone; Paxil; Prozac; and Zoloft  Home Medications   Current Outpatient Rx  Name  Route  Sig  Dispense  Refill  . amLODipine (NORVASC) 10 MG tablet   Oral   Take 1 tablet (10 mg total) by mouth daily. For BP   90 tablet   99   . atenolol (TENORMIN) 100 MG tablet      TAKE 1 TABLET EVERY DAY   90 tablet   4   . Cholecalciferol (VITAMIN D PO)   Oral   Take 1,000 Int'l Units by mouth daily.         . metFORMIN (GLUCOPHAGE-XR) 500 MG 24 hr tablet   Oral   Take 1 tablet (500 mg total) by mouth daily with breakfast. For Diabetes   90 tablet   99   . quinapril (ACCUPRIL) 40 MG tablet   Oral   Take 40 mg by mouth daily.          . TRAVATAN Z 0.004 % SOLN ophthalmic solution   Both Eyes   Place 1 drop into both eyes at bedtime.           BP 174/68  Pulse 62  Temp(Src) 98.5 F (36.9 C) (Oral)  Resp 20  SpO2 98% Physical Exam Gen: elderly white female, thin body habitus, pleasant,  nondistressed HEENT: large superior. Orbital ecchymosis above the left eye with mild bleeding that has achieved hemostasis; extraocular movements intact; pupils equal and react to light; no hemotympanum Chest wall: no obvious deformity, no ecchymosis, mild tenderness palpation of sternoclavicular junction of third and fourth ribs Heart: regular rhythm, no murmurs Pulmonary: no wheezing, clear to auscultation, No dyspnea Muscular skeletal: no tenderness or swelling of wrists or knee joints, normal active range of motion of those joints; no tenderness of cervical or thoracic spinous processes Skin: superficial abrasions of arms and knees bilaterally   ED Course  Procedures (including critical care time) Labs Review Labs Reviewed - No data to display Imaging Review Dg Chest 2 View  10/17/2013   CLINICAL DATA:  Pain post trauma  EXAM: CHEST  2 VIEW  COMPARISON:  June 17, 2013  FINDINGS: Lungs are clear.  Heart size and pulmonary vascularity are normal. No adenopathy. No bone lesions.  IMPRESSION: No edema or consolidation.   Electronically Signed   By: Bretta BangWilliam  Woodruff M.D.   On: 10/17/2013 17:43     MDM   1. Contusion of left eyebrow   2. Contusion of left chest wall   3. Abrasion of palm of hand    Trauma with superficial injuries. No concern for cardiac etiology. No concern for head injury aside from periorbital ecchymosis. Patient appropriate for discharge with conservative management.    Garnetta BuddyEdward V Quanah Majka, MD 10/17/13 [phone]

## 2013-11-19 ENCOUNTER — Ambulatory Visit (INDEPENDENT_AMBULATORY_CARE_PROVIDER_SITE_OTHER): Payer: Medicare Other | Admitting: Podiatry

## 2013-11-19 ENCOUNTER — Encounter: Payer: Self-pay | Admitting: Podiatry

## 2013-11-19 VITALS — BP 139/63 | HR 57 | Resp 16 | Ht 64.0 in | Wt 109.0 lb

## 2013-11-19 DIAGNOSIS — B351 Tinea unguium: Secondary | ICD-10-CM

## 2013-11-19 DIAGNOSIS — M79609 Pain in unspecified limb: Secondary | ICD-10-CM

## 2013-11-20 NOTE — Progress Notes (Signed)
Patient ID: Melissa Davis, female   DOB: 10/30/1940, 73 y.o.   MRN: [id number]  Subjective: Orientated x3 white female presents complaining of painful toenails  Objective: Elongated, hypertrophic, discolored toenails x10 Keratoses x2 (1 right and 1 left)  Assessment: Symptomatic onychomycoses x10 Keratoses x2  Plan: Nails x10 keratoses x2 debrided without a bleeding. Reappoint at three-month intervals

## 2013-12-29 ENCOUNTER — Encounter: Payer: Self-pay | Admitting: Physician Assistant

## 2013-12-29 ENCOUNTER — Ambulatory Visit (INDEPENDENT_AMBULATORY_CARE_PROVIDER_SITE_OTHER): Payer: Medicare Other | Admitting: Physician Assistant

## 2013-12-29 ENCOUNTER — Telehealth: Payer: Self-pay

## 2013-12-29 ENCOUNTER — Other Ambulatory Visit: Payer: Self-pay | Admitting: Physician Assistant

## 2013-12-29 VITALS — BP 100/60 | HR 56 | Temp 97.9°F | Resp 16 | Ht 63.0 in | Wt 111.0 lb

## 2013-12-29 DIAGNOSIS — Z9181 History of falling: Secondary | ICD-10-CM

## 2013-12-29 DIAGNOSIS — E119 Type 2 diabetes mellitus without complications: Secondary | ICD-10-CM

## 2013-12-29 DIAGNOSIS — F32A Depression, unspecified: Secondary | ICD-10-CM

## 2013-12-29 DIAGNOSIS — K219 Gastro-esophageal reflux disease without esophagitis: Secondary | ICD-10-CM

## 2013-12-29 DIAGNOSIS — F329 Major depressive disorder, single episode, unspecified: Secondary | ICD-10-CM

## 2013-12-29 DIAGNOSIS — E785 Hyperlipidemia, unspecified: Secondary | ICD-10-CM

## 2013-12-29 DIAGNOSIS — E2839 Other primary ovarian failure: Secondary | ICD-10-CM

## 2013-12-29 DIAGNOSIS — Z79899 Other long term (current) drug therapy: Secondary | ICD-10-CM

## 2013-12-29 DIAGNOSIS — Z0000 Encounter for general adult medical examination without abnormal findings: Secondary | ICD-10-CM

## 2013-12-29 DIAGNOSIS — Z1231 Encounter for screening mammogram for malignant neoplasm of breast: Secondary | ICD-10-CM

## 2013-12-29 DIAGNOSIS — E559 Vitamin D deficiency, unspecified: Secondary | ICD-10-CM

## 2013-12-29 DIAGNOSIS — I10 Essential (primary) hypertension: Secondary | ICD-10-CM

## 2013-12-29 LAB — HEMOGLOBIN A1C
Hgb A1c MFr Bld: 6.1 % — ABNORMAL HIGH (ref <5.7)
Mean Plasma Glucose: 128 mg/dL — ABNORMAL HIGH (ref <117)

## 2013-12-29 NOTE — Telephone Encounter (Signed)
DEXA scheduled 01-02-14 @ 9:15. No calcium the day before or day of scan. lmom to pt to return my call.

## 2013-12-29 NOTE — Patient Instructions (Addendum)
Blood pressure is on the low side, please drink plenty of fluids and cut your atenolol in half Think about shingles vaccine and PREVNAR 13, the new pneumonia vaccine  Preventative Care for Adults - Female      MAINTAIN REGULAR HEALTH EXAMS:  A routine yearly physical is a good way to check in with your primary care provider about your health and preventive screening. It is also an opportunity to share updates about your health and any concerns you have, and receive a thorough all-over exam.   Most health insurance companies pay for at least some preventative services.  Check with your health plan for specific coverages.  WHAT PREVENTATIVE SERVICES DO WOMEN NEED?  Adult women should have their weight and blood pressure checked regularly.   Women age 72 and older should have their cholesterol levels checked regularly.  Women should be screened for cervical cancer with a Pap smear and pelvic exam beginning at either age 72, or 3 years after they become sexually activity.    Breast cancer screening generally begins at age 72 with a mammogram and breast exam by your primary care provider.    Beginning at age 72 and continuing to age 72, women should be screened for colorectal cancer.  Certain people may need continued testing until age 72.  Updating vaccinations is part of preventative care.  Vaccinations help protect against diseases such as the flu.  Osteoporosis is a disease in which the bones lose minerals and strength as we age. Women ages 7065 and over should discuss this with their caregivers, as should women after menopause who have other risk factors.  Lab tests are generally done as part of preventative care to screen for anemia and blood disorders, to screen for problems with the kidneys and liver, to screen for bladder problems, to check blood sugar, and to check your cholesterol level.  Preventative services generally include counseling about diet, exercise, avoiding tobacco,  drugs, excessive alcohol consumption, and sexually transmitted infections.    GENERAL RECOMMENDATIONS FOR GOOD HEALTH:  Healthy diet:  Eat a variety of foods, including fruit, vegetables, animal or vegetable protein, such as meat, fish, chicken, and eggs, or beans, lentils, tofu, and grains, such as rice.  Drink plenty of water daily.  Decrease saturated fat in the diet, avoid lots of red meat, processed foods, sweets, fast foods, and fried foods.  Exercise:  Aerobic exercise helps maintain good heart health. At least 30-40 minutes of moderate-intensity exercise is recommended. For example, a brisk walk that increases your heart rate and breathing. This should be done on most days of the week.   Find a type of exercise or a variety of exercises that you enjoy so that it becomes a part of your daily life.  Examples are running, walking, swimming, water aerobics, and biking.  For motivation and support, explore group exercise such as aerobic class, spin class, Zumba, Yoga,or  martial arts, etc.    Set exercise goals for yourself, such as a certain weight goal, walk or run in a race such as a 5k walk/run.  Speak to your primary care provider about exercise goals.  Disease prevention:  If you smoke or chew tobacco, find out from your caregiver how to quit. It can literally save your life, no matter how long you have been a tobacco user. If you do not use tobacco, never begin.   Maintain a healthy diet and normal weight. Increased weight leads to problems with blood pressure and diabetes.  The Body Mass Index or BMI is a way of measuring how much of your body is fat. Having a BMI above 27 increases the risk of heart disease, diabetes, hypertension, stroke and other problems related to obesity. Your caregiver can help determine your BMI and based on it develop an exercise and dietary program to help you achieve or maintain this important measurement at a healthful level.  High blood pressure  causes heart and blood vessel problems.  Persistent high blood pressure should be treated with medicine if weight loss and exercise do not work.   Fat and cholesterol leaves deposits in your arteries that can block them. This causes heart disease and vessel disease elsewhere in your body.  If your cholesterol is found to be high, or if you have heart disease or certain other medical conditions, then you may need to have your cholesterol monitored frequently and be treated with medication.   Ask if you should have a cardiac stress test if your history suggests this. A stress test is a test done on a treadmill that looks for heart disease. This test can find disease prior to there being a problem.  Menopause can be associated with physical symptoms and risks. Hormone replacement therapy is available to decrease these. You should talk to your caregiver about whether starting or continuing to take hormones is right for you.   Osteoporosis is a disease in which the bones lose minerals and strength as we age. This can result in serious bone fractures. Risk of osteoporosis can be identified using a bone density scan. Women ages 32 and over should discuss this with their caregivers, as should women after menopause who have other risk factors. Ask your caregiver whether you should be taking a calcium supplement and Vitamin D, to reduce the rate of osteoporosis.   Avoid drinking alcohol in excess (more than two drinks per day).  Avoid use of street drugs. Do not share needles with anyone. Ask for professional help if you need assistance or instructions on stopping the use of alcohol, cigarettes, and/or drugs.  Brush your teeth twice a day with fluoride toothpaste, and floss once a day. Good oral hygiene prevents tooth decay and gum disease. The problems can be painful, unattractive, and can cause other health problems. Visit your dentist for a routine oral and dental check up and preventive care every 6-12 months.    Look at your skin regularly.  Use a mirror to look at your back. Notify your caregivers of changes in moles, especially if there are changes in shapes, colors, a size larger than a pencil eraser, an irregular border, or development of new moles.  Safety:  Use seatbelts 100% of the time, whether driving or as a passenger.  Use safety devices such as hearing protection if you work in environments with loud noise or significant background noise.  Use safety glasses when doing any work that could send debris in to the eyes.  Use a helmet if you ride a bike or motorcycle.  Use appropriate safety gear for contact sports.  Talk to your caregiver about gun safety.  Use sunscreen with a SPF (or skin protection factor) of 15 or greater.  Lighter skinned people are at a greater risk of skin cancer. Don't forget to also wear sunglasses in order to protect your eyes from too much damaging sunlight. Damaging sunlight can accelerate cataract formation.   Practice safe sex. Use condoms. Condoms are used for birth control and to help reduce the  spread of sexually transmitted infections (or STIs).  Some of the STIs are gonorrhea (the clap), chlamydia, syphilis, trichomonas, herpes, HPV (human papilloma virus) and HIV (human immunodeficiency virus) which causes AIDS. The herpes, HIV and HPV are viral illnesses that have no cure. These can result in disability, cancer and death.   Keep carbon monoxide and smoke detectors in your home functioning at all times. Change the batteries every 6 months or use a model that plugs into the wall.   Vaccinations:  Stay up to date with your tetanus shots and other required immunizations. You should have a booster for tetanus every 10 years. Be sure to get your flu shot every year, since 5%-20% of the [place] population comes down with the flu. The flu vaccine changes each year, so being vaccinated once is not enough. Get your shot in the fall, before the flu season peaks.   Other  vaccines to consider:  Human Papilloma Virus or HPV causes cancer of the cervix, and other infections that can be transmitted from person to person. There is a vaccine for HPV, and females should get immunized between the ages of 81 and 50. It requires a series of 3 shots.   Pneumococcal vaccine to protect against certain types of pneumonia.  This is normally recommended for adults age 1 or older.  However, adults younger than 72 years old with certain underlying conditions such as diabetes, heart or lung disease should also receive the vaccine.  Shingles vaccine to protect against Varicella Zoster if you are older than age 35, or younger than 72 years old with certain underlying illness.  Hepatitis A vaccine to protect against a form of infection of the liver by a virus acquired from food.  Hepatitis B vaccine to protect against a form of infection of the liver by a virus acquired from blood or body fluids, particularly if you work in health care.  If you plan to travel internationally, check with your local health department for specific vaccination recommendations.  Cancer Screening:  Breast cancer screening is essential to preventive care for women. All women age 59 and older should perform a breast self-exam every month. At age 3 and older, women should have their caregiver complete a breast exam each year. Women at ages 67 and older should have a mammogram (x-ray film) of the breasts. Your caregiver can discuss how often you need mammograms.    Cervical cancer screening includes taking a Pap smear (sample of cells examined under a microscope) from the cervix (end of the uterus). It also includes testing for HPV (Human Papilloma Virus, which can cause cervical cancer). Screening and a pelvic exam should begin at age 58, or 3 years after a woman becomes sexually active. Screening should occur every year, with a Pap smear but no HPV testing, up to age 70. After age 63, you should have a Pap  smear every 3 years with HPV testing, if no HPV was found previously.   Most routine colon cancer screening begins at the age of 98. On a yearly basis, doctors may provide special easy to use take-home tests to check for hidden blood in the stool. Sigmoidoscopy or colonoscopy can detect the earliest forms of colon cancer and is life saving. These tests use a small camera at the end of a tube to directly examine the colon. Speak to your caregiver about this at age 64, when routine screening begins (and is repeated every 5 years unless early forms of pre-cancerous polyps or  small growths are found).    Shingles Vaccine What You Need to Know WHAT IS SHINGLES?  Shingles is a painful skin rash, often with blisters. It is also called Herpes Zoster or just Zoster.  A shingles rash usually appears on one side of the face or body and lasts from 2 to 4 weeks. Its main symptom is pain, which can be quite severe. Other symptoms of shingles can include fever, headache, chills, and upset stomach. Very rarely, a shingles infection can lead to pneumonia, hearing problems, blindness, brain inflammation (encephalitis), or death.  For about 1 person in 5, severe pain can continue even after the rash clears up. This is called post-herpetic neuralgia.  Shingles is caused by the Varicella Zoster virus. This is the same virus that causes chickenpox. Only someone who has had a case of chickenpox or rarely, has gotten chickenpox vaccine, can get shingles. The virus stays in your body. It can reappear many years later to cause a case of shingles.  You cannot catch shingles from another person with shingles. However, a person who has never had chickenpox (or chickenpox vaccine) could get chickenpox from someone with shingles. This is not very common.  Shingles is far more common in people 5550 and older than in younger people. It is also more common in people whose immune systems are weakened because of a disease such as  cancer or drugs such as steroids or chemotherapy.  At least 1 million people get shingles per year in the [place]. SHINGLES VACCINE  A vaccine for shingles was licensed in 2006. In clinical trials, the vaccine reduced the risk of shingles by 50%. It can also reduce the pain in people who still get shingles after being vaccinated.  A single dose of shingles vaccine is recommended for adults 72 years of age and older. SOME PEOPLE SHOULD NOT GET SHINGLES VACCINE OR SHOULD WAIT A person should not get shingles vaccine if he or she:  Has ever had a life-threatening allergic reaction to gelatin, the antibiotic neomycin, or any other component of shingles vaccine. Tell your caregiver if you have any severe allergies.  Has a weakened immune system because of current:  AIDS or another disease that affects the immune system.  Treatment with drugs that affect the immune system, such as prolonged use of high-dose steroids.  Cancer treatment, such as radiation or chemotherapy.  Cancer affecting the bone marrow or lymphatic system, such as leukemia or lymphoma.  Is pregnant, or might be pregnant. Women should not become pregnant until at least 4 weeks after getting shingles vaccine. Someone with a minor illness, such as a cold, may be vaccinated. Anyone with a moderate or severe acute illness should usually wait until he or she recovers before getting the vaccine. This includes anyone with a temperature of 101.3 F (38 C) or higher. WHAT ARE THE RISKS FROM SHINGLES VACCINE?  A vaccine, like any medicine, could possibly cause serious problems, such as severe allergic reactions. However, the risk of a vaccine causing serious harm, or death, is extremely small.  No serious problems have been identified with shingles vaccine. Mild Problems  Redness, soreness, swelling, or itching at the site of the injection (about 1 person in 3).  Headache (about 1 person in 70). Like all vaccines, shingles  vaccine is being closely monitored for unusual or severe problems. WHAT IF THERE IS A MODERATE OR SEVERE REACTION? What should I look for? Any unusual condition, such as a severe allergic reaction or  a high fever. If a severe allergic reaction occurred, it would be within a few minutes to an hour after the shot. Signs of a serious allergic reaction can include difficulty breathing, weakness, hoarseness or wheezing, a fast heartbeat, hives, dizziness, paleness, or swelling of the throat. What should I do?  Call your caregiver, or get the person to a caregiver right away.  Tell the caregiver what happened, the date and time it happened, and when the vaccination was given.  Ask the caregiver to report the reaction by filing a Vaccine Adverse Event Reporting System (VAERS) form. Or, you can file this report through the VAERS web site at www.vaers.LAgents.no or by calling 1-607-695-3626. VAERS does not provide medical advice. HOW CAN I LEARN MORE?  Ask your caregiver. He or she can give you the vaccine package insert or suggest other sources of information.  Contact the Centers for Disease Control and Prevention (CDC):  Call (501) 028-2872 (1-800-CDC-INFO).  Visit the CDC website at PicCapture.uy CDC Shingles Vaccine VIS (04/21/08) Document Released: 04/30/2006 Document Revised: 09/25/2011 Document Reviewed: 10/23/2012 [facility] Patient Information 2014 [place], [place].

## 2013-12-29 NOTE — Progress Notes (Signed)
MEDICARE ANNUAL WELLNESS VISIT AND FOLLOW UP  Assessment:   1. Hypertension Some hypotension with history of two falls which she states were from tripping, will still cut atenolol in 1/2 and have her monitor BP - CBC with Differential - BASIC METABOLIC PANEL WITH GFR - Hepatic function panel - TSH  2. GERD (gastroesophageal reflux disease) -controlled  3. T2 NIDDM Discussed general issues about diabetes pathophysiology and management., Educational material distributed., Suggested low cholesterol diet., Encouraged aerobic exercise., Discussed foot care., Reminded to get yearly retinal exam. - Hemoglobin A1c - Insulin, fasting - HM DIABETES FOOT EXAM  4. Hyperlipidemia - Lipid panel  5. Vitamin D deficiency - Vit D  25 hydroxy (rtn osteoporosis monitoring)  6. Depression -remission  7. Encounter for long-term (current) use of other medications - Magnesium  8. Estrogen deficiency - DG Bone Density; Future  9. Vaccines Will consider shingles vaccine and Prevnar for next visit, given information  Plan:   During the course of the visit the patient was educated and counseled about appropriate screening and preventive services including:    Pneumococcal vaccine   Influenza vaccine  Td vaccine  Screening electrocardiogram  Screening mammography  Bone densitometry screening  Colorectal cancer screening  Diabetes screening  Glaucoma screening  Nutrition counseling   Advanced directives: given info/requested  Screening recommendations, referrals:  Vaccinations: Tdap vaccine up to date Influenza vaccine up to date Pneumococcal vaccine up to date Shingles vaccine declined Hep B vaccine not indicated  Nutrition assessed and recommended  Colonoscopy due 2021 Mammogram ordered Pap smear not indicated Pelvic exam not indicated Recommended yearly ophthalmology/optometry visit for glaucoma screening and checkup Recommended yearly dental visit for hygiene  and checkup Advanced directives - requested  Conditions/risks identified: BMI: Discussed weight loss, diet, and increase physical activity.  Increase physical activity: AHA recommends 150 minutes of physical activity a week.  Medications reviewed DEXA- ordered Diabetes is at goal, ACE/ARB therapy: Yes. Urinary Incontinence is not an issue: discussed non pharmacology and pharmacology options.  Fall risk: moderate-high risk- discussed PT, home fall assessment, medications.    Subjective:   Melissa Davis is a 73 y.o. female who presents for Medicare Annual Wellness Visit and 3 month follow up on hypertension, prediabetes, hyperlipidemia, vitamin D def.  Date of last medicare wellness visit is unknown.   Her blood pressure has been controlled at home, today their BP is BP: 100/60 mmHg She does workout, she keeps her 87 year old grandson some days and walks. She denies chest pain, shortness of breath, dizziness.  She is not on cholesterol medication and denies myalgias. Her cholesterol is at goal. The cholesterol last visit was:   Lab Results  Component Value Date   CHOL 158 09/26/2013   HDL 61 09/26/2013   LDLCALC 77 09/26/2013   TRIG 102 09/26/2013   CHOLHDL 2.6 09/26/2013   She has been working on diet and exercise for prediabetes, and denies paresthesia of the feet, polydipsia and polyuria. Last A1C in the office was:  Lab Results  Component Value Date   HGBA1C 5.8* 09/26/2013   Patient is on Vitamin D supplement.  Names of Other Physician/Practitioners you currently use: 1. [facility]- here for primary care 2. Dr. Karl Ito, dentist, last visit q 6 mons Patient Care Team: Lucky Cowboy, MD as PCP - General (Internal Medicine) Richarda Overlie, MD as Consulting Physician (Ophthalmology)- saw Friday and pressures were 15 Hart Carwin, MD as Consulting Physician (Gastroenterology)  Medication  Review Current Outpatient Prescriptions on File  Prior to Visit  Medication Sig Dispense Refill  . amLODipine (NORVASC) 10 MG tablet Take 1 tablet (10 mg total) by mouth daily. For BP  90 tablet  99  . atenolol (TENORMIN) 100 MG tablet TAKE 1 TABLET EVERY DAY  90 tablet  4  . Cholecalciferol (VITAMIN D PO) Take 1,000 Int'l Units by mouth daily.      . metFORMIN (GLUCOPHAGE-XR) 500 MG 24 hr tablet Take 1 tablet (500 mg total) by mouth daily with breakfast. For Diabetes  90 tablet  99  . quinapril (ACCUPRIL) 40 MG tablet Take 40 mg by mouth daily.       . TRAVATAN Z 0.004 % SOLN ophthalmic solution Place 1 drop into both eyes at bedtime.        No current facility-administered medications on file prior to visit.    Current Problems (verified) Patient Active Problem List   Diagnosis Date Noted  . Encounter for long-term (current) use of other medications 09/26/2013  . Glaucoma 09/26/2013  . T2 NIDDM   . Hyperlipidemia   . Hypertension   . Vitamin D deficiency   . GERD (gastroesophageal reflux disease)   . Depression     Screening Tests Health Maintenance  Topic Date Due  . Zostavax  12/07/2001  . Pneumococcal Polysaccharide Vaccine Age 165 And Over  12/08/2006  . Mammogram  06/29/2013  . Influenza Vaccine  02/14/2014  . Colonoscopy  07/24/2019  . Tetanus/tdap  06/17/2022     Immunization History  Administered Date(s) Administered  . Influenza Split 05/01/2013  . Pneumococcal Polysaccharide-23 08/05/1998  . Td 06/17/2012    Preventative care: Tetanus: 2013  Pneumovax: 2000  Flu vaccine: 05/01/2013  Zostavax:  Pap: 2010 neg declines another MGM: 06/2011 Due this year  DEXA: 2012 due this year  Colonoscopy: 2011 due 10 years  EGD: N/A DEE: 04/2013 Cath: 2010 normal EF 60%  History reviewed: allergies, current medications, past family history, past medical history, past social history, past surgical history and problem list  Risk Factors: Osteoporosis: postmenopausal estrogen deficiency and dietary calcium and/or  vitamin D deficiency History of fracture in the past year: no  Tobacco History  Substance Use Topics  . Smoking status: Never Smoker   . Smokeless tobacco: Not on file  . Alcohol Use: No   She does not smoke.  Patient is not a former smoker. Are there smokers in your home (other than you)?  No  Alcohol Current alcohol use: none  Caffeine Current caffeine use: coffee 1-2 /day  Exercise Current exercise: walking, yard work and playing with grandson  Nutrition/Diet Current diet: in general, a "healthy" diet    Cardiac risk factors: advanced age (older than 3755 for men, 4765 for women), dyslipidemia, hypertension and sedentary lifestyle.  Depression Screen (Note: if answer to either of the following is "Yes", a more complete depression screening is indicated)   Q1: Over the past two weeks, have you felt down, depressed or hopeless? No  Q2: Over the past two weeks, have you felt little interest or pleasure in doing things? No  Have you lost interest or pleasure in daily life? No  Do you often feel hopeless? No  Do you cry easily over simple problems? No  Activities of Daily Living In your present state of health, do you have any difficulty performing the following activities?:  Driving? No Managing money?  No Feeding yourself? No Getting from bed to chair? No Climbing a flight  of stairs? No Preparing food and eating?: No Bathing or showering? No Getting dressed: No Getting to the toilet? No Using the toilet:No Moving around from place to place: No In the past year have you fallen or had a near fall?:Yes- tripped x 2, no fracutres   Are you sexually active?  No  Do you have more than one partner?  No  Vision Difficulties: Yes  Hearing Difficulties: No Do you often ask people to speak up or repeat themselves? No Do you experience ringing or noises in your ears? No Do you have difficulty understanding soft or whispered voices? No  Cognition  Do you feel that you  have a problem with memory?No  Do you often misplace items? No  Do you feel safe at home?  Yes  Advanced directives Does patient have a Health Care Power of Attorney? Yes Does patient have a Living Will? Yes   Objective:   Blood pressure 100/60, pulse 56, temperature 97.9 F (36.6 C), resp. rate 16, height 5\' 3"  (1.6 m), weight 111 lb (50.349 kg). Body mass index is 19.67 kg/(m^2).  General appearance: alert, no distress, WD/WN,  female Cognitive Testing  Alert? Yes  Normal Appearance?Yes  Oriented to person? Yes  Place? Yes   Time? No  Recall of three objects?  Yes  Can perform simple calculations? Yes  Displays appropriate judgment?Yes  Can read the correct time from a watch face?Yes  HEENT: normocephalic, sclerae anicteric, TMs pearly, nares patent, no discharge or erythema, pharynx normal Oral cavity: MMM, no lesions Neck: supple, no lymphadenopathy, no thyromegaly, no masses Heart: RRR, normal S1, S2, no murmurs Lungs: CTA bilaterally, no wheezes, rhonchi, or rales Abdomen: +bs, soft, non tender, non distended, no masses, no hepatomegaly, no splenomegaly Musculoskeletal: nontender, no swelling, no obvious deformity, mild cervical kyphosis Extremities: no edema, no cyanosis, no clubbing Pulses: 2+ symmetric, upper and lower extremities, normal cap refill Neurological: alert, oriented x 3, CN2-12 intact, strength normal upper extremities and lower extremities, sensation normal throughout, DTRs 2+ throughout, no cerebellar signs, gait antalgic with cane Psychiatric: normal affect, behavior normal, pleasant  Breast: defer Gyn: defer Rectal: defer  Medicare Attestation I have personally reviewed: The patient's medical and social history Their use of alcohol, tobacco or illicit drugs Their current medications and supplements The patient's functional ability including ADLs,fall risks, home safety risks, cognitive, and hearing and visual impairment Diet and physical  activities Evidence for depression or mood disorders  The patient's weight, height, BMI, and visual acuity have been recorded in the chart.  I have made referrals, counseling, and provided education to the patient based on review of the above and I have provided the patient with a written personalized care plan for preventive services.     Melissa Davis, Melissa Botelho, PA-C   12/29/2013

## 2013-12-29 NOTE — Telephone Encounter (Signed)
Pt returned call & is aware of DEXA scan.

## 2013-12-29 NOTE — Telephone Encounter (Signed)
Message copied by Joya MartyrZMENT, Greysen Devino M on Mon Dec 29, 2013  2:17 PM ------      Message from: Quentin MullingOLLIER, AMANDA R      Created: Mon Dec 29, 2013 10:02 AM       Schedule DEXA ------

## 2013-12-30 LAB — CBC WITH DIFFERENTIAL/PLATELET
Basophils Absolute: 0.1 10*3/uL (ref 0.0–0.1)
Basophils Relative: 1 % (ref 0–1)
EOS PCT: 2 % (ref 0–5)
Eosinophils Absolute: 0.1 10*3/uL (ref 0.0–0.7)
HCT: 40.2 % (ref 36.0–46.0)
Hemoglobin: 14 g/dL (ref 12.0–15.0)
LYMPHS PCT: 33 % (ref 12–46)
Lymphs Abs: 1.9 10*3/uL (ref 0.7–4.0)
MCH: 30.8 pg (ref 26.0–34.0)
MCHC: 34.8 g/dL (ref 30.0–36.0)
MCV: 88.4 fL (ref 78.0–100.0)
Monocytes Absolute: 0.4 10*3/uL (ref 0.1–1.0)
Monocytes Relative: 7 % (ref 3–12)
NEUTROS ABS: 3.2 10*3/uL (ref 1.7–7.7)
NEUTROS PCT: 57 % (ref 43–77)
PLATELETS: 262 10*3/uL (ref 150–400)
RBC: 4.55 MIL/uL (ref 3.87–5.11)
RDW: 13.6 % (ref 11.5–15.5)
WBC: 5.7 10*3/uL (ref 4.0–10.5)

## 2013-12-30 LAB — BASIC METABOLIC PANEL WITH GFR
BUN: 18 mg/dL (ref 6–23)
CHLORIDE: 101 meq/L (ref 96–112)
CO2: 28 mEq/L (ref 19–32)
Calcium: 9.4 mg/dL (ref 8.4–10.5)
Creat: 0.63 mg/dL (ref 0.50–1.10)
GFR, Est African American: >89 mL/min
Glucose, Bld: 91 mg/dL (ref 70–99)
Potassium: 4.5 mEq/L (ref 3.5–5.3)
SODIUM: 140 meq/L (ref 135–145)

## 2013-12-30 LAB — HEPATIC FUNCTION PANEL
ALK PHOS: 72 U/L (ref 39–117)
ALT: 13 U/L (ref 0–35)
AST: 20 U/L (ref 0–37)
Albumin: 4.5 g/dL (ref 3.5–5.2)
BILIRUBIN DIRECT: 0.1 mg/dL (ref 0.0–0.3)
BILIRUBIN TOTAL: 0.8 mg/dL (ref 0.2–1.2)
Indirect Bilirubin: 0.7 mg/dL (ref 0.2–1.2)
Total Protein: 7 g/dL (ref 6.0–8.3)

## 2013-12-30 LAB — LIPID PANEL
CHOL/HDL RATIO: 2.7 ratio
Cholesterol: 183 mg/dL (ref 0–200)
HDL: 69 mg/dL (ref >39)
LDL Cholesterol: 97 mg/dL (ref 0–99)
Triglycerides: 87 mg/dL (ref <150)
VLDL: 17 mg/dL (ref 0–40)

## 2013-12-30 LAB — INSULIN, FASTING: Insulin fasting, serum: 6 u[IU]/mL (ref 3–28)

## 2013-12-30 LAB — MAGNESIUM: Magnesium: 1.9 mg/dL (ref 1.5–2.5)

## 2013-12-30 LAB — VITAMIN D 25 HYDROXY (VIT D DEFICIENCY, FRACTURES): Vit D, 25-Hydroxy: 31 ng/mL (ref 30–89)

## 2013-12-30 LAB — TSH: TSH: 0.958 u[IU]/mL (ref 0.350–4.500)

## 2014-01-02 ENCOUNTER — Ambulatory Visit (HOSPITAL_COMMUNITY)
Admission: RE | Admit: 2014-01-02 | Discharge: 2014-01-02 | Disposition: A | Discharge status: Home / Self Care | Payer: Medicare Other | Source: Ambulatory Visit | Attending: Physician Assistant | Admitting: Physician Assistant

## 2014-01-02 ENCOUNTER — Telehealth: Payer: Self-pay

## 2014-01-02 DIAGNOSIS — Z13820 Encounter for screening for osteoporosis: Secondary | ICD-10-CM | POA: Insufficient documentation

## 2014-01-02 DIAGNOSIS — E2839 Other primary ovarian failure: Secondary | ICD-10-CM

## 2014-01-02 DIAGNOSIS — Z1231 Encounter for screening mammogram for malignant neoplasm of breast: Secondary | ICD-10-CM | POA: Insufficient documentation

## 2014-01-02 DIAGNOSIS — Z780 Asymptomatic menopausal state: Secondary | ICD-10-CM | POA: Insufficient documentation

## 2014-01-02 NOTE — Telephone Encounter (Signed)
lmom for pt to return call for bone density results.

## 2014-01-02 NOTE — Telephone Encounter (Signed)
Message copied by Joya MartyrZMENT, LINDSEY M on Fri Jan 02, 2014 10:42 AM ------      Message from: Quentin MullingOLLIER, AMANDA R      Created: Fri Jan 02, 2014 10:07 AM       Osteopenia, continue vitamin D, calcium, and add weight bearing exercises/walking. ------

## 2014-01-20 ENCOUNTER — Other Ambulatory Visit: Payer: Self-pay | Admitting: Physician Assistant

## 2014-02-16 ENCOUNTER — Other Ambulatory Visit: Payer: Self-pay | Admitting: Internal Medicine

## 2014-02-18 ENCOUNTER — Encounter: Payer: Self-pay | Admitting: Podiatry

## 2014-02-18 ENCOUNTER — Ambulatory Visit (INDEPENDENT_AMBULATORY_CARE_PROVIDER_SITE_OTHER): Payer: Medicare Other | Admitting: Podiatry

## 2014-02-18 VITALS — BP 122/86 | HR 84 | Resp 12

## 2014-02-18 DIAGNOSIS — B351 Tinea unguium: Secondary | ICD-10-CM

## 2014-02-18 DIAGNOSIS — M79673 Pain in unspecified foot: Secondary | ICD-10-CM

## 2014-02-18 DIAGNOSIS — M79609 Pain in unspecified limb: Secondary | ICD-10-CM

## 2014-02-19 NOTE — Progress Notes (Signed)
Patient ID: Melissa Davis, female   DOB: 1940-12-16, 73 y.o.   MRN: [id number]  Subjective: This patient presents today complaining of painful toenails  Objective: Orientated x3 white female Elongated, hypertrophic, discolored toenails 6-10  Assessment: Symptomatic onychomycosis 6-10  Plan: The nails are debrided x10 without any bleeding  Reappoint at three-month intervals

## 2014-03-04 ENCOUNTER — Encounter: Payer: Self-pay | Admitting: Internal Medicine

## 2014-03-31 ENCOUNTER — Ambulatory Visit (INDEPENDENT_AMBULATORY_CARE_PROVIDER_SITE_OTHER): Payer: Medicare Other | Admitting: Internal Medicine

## 2014-03-31 ENCOUNTER — Encounter: Payer: Self-pay | Admitting: Internal Medicine

## 2014-03-31 ENCOUNTER — Ambulatory Visit: Payer: Self-pay | Admitting: Emergency Medicine

## 2014-03-31 VITALS — BP 142/60 | HR 60 | Temp 97.7°F | Resp 16 | Ht 63.0 in | Wt 113.6 lb

## 2014-03-31 DIAGNOSIS — Z79899 Other long term (current) drug therapy: Secondary | ICD-10-CM

## 2014-03-31 DIAGNOSIS — E119 Type 2 diabetes mellitus without complications: Secondary | ICD-10-CM

## 2014-03-31 DIAGNOSIS — Z23 Encounter for immunization: Secondary | ICD-10-CM

## 2014-03-31 DIAGNOSIS — E785 Hyperlipidemia, unspecified: Secondary | ICD-10-CM

## 2014-03-31 DIAGNOSIS — I10 Essential (primary) hypertension: Secondary | ICD-10-CM

## 2014-03-31 DIAGNOSIS — E559 Vitamin D deficiency, unspecified: Secondary | ICD-10-CM

## 2014-03-31 LAB — CBC WITH DIFFERENTIAL/PLATELET
BASOS PCT: 1 % (ref 0–1)
Basophils Absolute: 0.1 10*3/uL (ref 0.0–0.1)
Eosinophils Absolute: 0.1 10*3/uL (ref 0.0–0.7)
Eosinophils Relative: 1 % (ref 0–5)
HEMATOCRIT: 39.3 % (ref 36.0–46.0)
HEMOGLOBIN: 13.4 g/dL (ref 12.0–15.0)
LYMPHS ABS: 1.6 10*3/uL (ref 0.7–4.0)
Lymphocytes Relative: 24 % (ref 12–46)
MCH: 30.6 pg (ref 26.0–34.0)
MCHC: 34.1 g/dL (ref 30.0–36.0)
MCV: 89.7 fL (ref 78.0–100.0)
MONO ABS: 0.5 10*3/uL (ref 0.1–1.0)
Monocytes Relative: 7 % (ref 3–12)
NEUTROS ABS: 4.4 10*3/uL (ref 1.7–7.7)
NEUTROS PCT: 67 % (ref 43–77)
Platelets: 246 10*3/uL (ref 150–400)
RBC: 4.38 MIL/uL (ref 3.87–5.11)
RDW: 13.2 % (ref 11.5–15.5)
WBC: 6.6 10*3/uL (ref 4.0–10.5)

## 2014-03-31 LAB — HEMOGLOBIN A1C
HEMOGLOBIN A1C: 6.2 % — AB (ref <5.7)
MEAN PLASMA GLUCOSE: 131 mg/dL — AB (ref <117)

## 2014-03-31 NOTE — Progress Notes (Signed)
Patient ID: Melissa Davis, female   DOB: 03/17/1941, 73 y.o.   MRN: [id number]   This very nice 73 y.o.WWF presents for 3 month follow up with Hypertension, Hyperlipidemia, T2_NIDDM and Vitamin D Deficiency.    Patient is treated for HTN & BP has been controlled at home. Today's BP: 142/60 mmHg. Patient denies any cardiac type chest pain, palpitations, dyspnea/orthopnea/PND, dizziness, claudication, or dependent edema.   Hyperlipidemia is controlled with diet. Patient denies myalgias or other med SE's. Last Lipids were at goal - Total Chol 183; HDL  69; LDL  97; Trig 87 on  12/29/2013.   Also, the patient has history of T2_NIDDM and reports FBG's range betw 80-100 mg%.  She denies any symptoms of reactive hypoglycemia, diabetic polys, paresthesias or visual blurring.  Last A1c was  6.1% on  12/29/2013.    Further, Patient has history of Vitamin D Deficiency and patient supplements vitamin D without any suspected side-effects. Last vitamin D was 31 on  12/29/2013.   Medication List   amLODipine 10 MG tablet  Commonly known as:  NORVASC  Take 1 tablet (10 mg total) by mouth daily. For BP     atenolol 100 MG tablet  Commonly known as:  TENORMIN  TAKE 1 TABLET EVERY DAY     clorazepate 7.5 MG tablet  Commonly known as:  TRANXENE  TAKE 1 TABLET EVERY DAY AS NEEDED     metFORMIN 500 MG 24 hr tablet  Commonly known as:  GLUCOPHAGE-XR  Take 1 tablet (500 mg total) by mouth daily with breakfast. For Diabetes     ONE TOUCH ULTRA TEST test strip  Generic drug:  glucose blood  Test 2 to 3 times daily     onetouch ultrasoft lancets  Test once daily     quinapril 40 MG tablet  Commonly known as:  ACCUPRIL  Take 40 mg by mouth daily.     TRAVATAN Z 0.004 % Soln ophthalmic solution  Generic drug:  Travoprost (BAK Free)  Place 1 drop into both eyes at bedtime.     VITAMIN D PO  Take 1,000 Int'l Units by mouth daily.     Allergies  Allergen Reactions  . Buspirone   . Clonidine Derivatives    . Cymbalta [Duloxetine Hcl]   . Elavil [Amitriptyline]   . Hytrin [Terazosin]   . Nizofenone   . Paxil [Paroxetine Hcl]   . Prozac [Fluoxetine Hcl]   . Zoloft [Sertraline Hcl]    PMHx:   Past Medical History  Diagnosis Date  . Hyperlipidemia   . Hypertension   . Vitamin D deficiency   . GERD (gastroesophageal reflux disease)   . Type II or unspecified type diabetes mellitus without mention of complication, not stated as uncontrolled   . Depression    FHx:    Reviewed / unchanged SHx:    Reviewed / unchanged  Systems Review:  Constitutional: Denies fever, chills, wt changes, headaches, insomnia, fatigue, night sweats, change in appetite. Eyes: Denies redness, blurred vision, diplopia, discharge, itchy, watery eyes.  ENT: Denies discharge, congestion, post nasal drip, epistaxis, sore throat, earache, hearing loss, dental pain, tinnitus, vertigo, sinus pain, snoring.  CV: Denies chest pain, palpitations, irregular heartbeat, syncope, dyspnea, diaphoresis, orthopnea, PND, claudication or edema. Respiratory: denies cough, dyspnea, DOE, pleurisy, hoarseness, laryngitis, wheezing.  Gastrointestinal: Denies dysphagia, odynophagia, heartburn, reflux, water brash, abdominal pain or cramps, nausea, vomiting, bloating, diarrhea, constipation, hematemesis, melena, hematochezia  or hemorrhoids. Genitourinary: Denies dysuria, frequency, urgency, nocturia, hesitancy, discharge,  hematuria or flank pain. Musculoskeletal: Denies arthralgias, myalgias, stiffness, jt. swelling, pain, limping or strain/sprain.  Skin: Denies pruritus, rash, hives, warts, acne, eczema or change in skin lesion(s). Neuro: No weakness, tremor, incoordination, spasms, paresthesia or pain. Psychiatric: Denies confusion, memory loss or sensory loss. Endo: Denies change in weight, skin or hair change.  Heme/Lymph: No excessive bleeding, bruising or enlarged lymph nodes.  Exam:  BP 142/60  P 60  T 97.7 F   Resp 16  Ht     Wt 113 lb 9.6 oz  BMI 20.13  Appears well nourished and in no distress. Eyes: PERRLA, EOMs, conjunctiva no swelling or erythema. Sinuses: No frontal/maxillary tenderness ENT/Mouth: EAC's clear, TM's nl w/o erythema, bulging. Nares clear w/o erythema, swelling, exudates. Oropharynx clear without erythema or exudates. Oral hygiene is good. Tongue normal, non obstructing. Hearing intact.  Neck: Supple. Thyroid nl. Car 2+/2+ without bruits, nodes or JVD. Chest: Respirations nl with BS clear & equal w/o rales, rhonchi, wheezing or stridor.  Cor: Heart sounds normal w/ regular rate and rhythm without sig. murmurs, gallops, clicks, or rubs. Peripheral pulses normal and equal  without edema.  Abdomen: Soft & bowel sounds normal. Non-tender w/o guarding, rebound, hernias, masses, or organomegaly.  Lymphatics: Unremarkable.  Musculoskeletal: Full ROM all peripheral extremities, joint stability, 5/5 strength, and normal gait.  Skin: Warm, dry without exposed rashes, lesions or ecchymosis apparent.  Neuro: Cranial nerves intact, reflexes equal bilaterally. Sensory-motor testing grossly intact. Tendon reflexes grossly intact.  Pysch: Alert & oriented x 3.  Insight and judgement nl & appropriate. No ideations.  Assessment and Plan:  1. Hypertension - Continue monitor blood pressure at home. Continue diet/meds same.  2. Hyperlipidemia - Continue diet/meds, exercise,& lifestyle modifications. Continue monitor periodic cholesterol/liver & renal functions   3. T2_NIDDM- Continue diet, exercise, lifestyle modifications. Monitor appropriate labs.  4. Vitamin D Deficiency - Continue supplementation.   Recommended regular exercise, BP monitoring, weight control, and discussed med and SE's. Recommended labs to assess and monitor clinical status. Further disposition pending results of labs.  ROV 3 Mo for CPE w/ Quentin Mulling.

## 2014-03-31 NOTE — Patient Instructions (Signed)

## 2014-04-01 LAB — BASIC METABOLIC PANEL WITH GFR
BUN: 18 mg/dL (ref 6–23)
CO2: 30 mEq/L (ref 19–32)
Calcium: 9.5 mg/dL (ref 8.4–10.5)
Chloride: 102 mEq/L (ref 96–112)
Creat: 0.66 mg/dL (ref 0.50–1.10)
GFR, Est Non African American: 89 mL/min
GLUCOSE: 82 mg/dL (ref 70–99)
Potassium: 4.3 mEq/L (ref 3.5–5.3)
Sodium: 140 mEq/L (ref 135–145)

## 2014-04-01 LAB — LIPID PANEL
CHOL/HDL RATIO: 2.7 ratio
Cholesterol: 176 mg/dL (ref 0–200)
HDL: 66 mg/dL (ref >39)
LDL CALC: 89 mg/dL (ref 0–99)
Triglycerides: 105 mg/dL (ref <150)
VLDL: 21 mg/dL (ref 0–40)

## 2014-04-01 LAB — HEPATIC FUNCTION PANEL
ALK PHOS: 77 U/L (ref 39–117)
ALT: 11 U/L (ref 0–35)
AST: 19 U/L (ref 0–37)
Albumin: 4.4 g/dL (ref 3.5–5.2)
BILIRUBIN DIRECT: 0.1 mg/dL (ref 0.0–0.3)
Indirect Bilirubin: 0.7 mg/dL (ref 0.2–1.2)
TOTAL PROTEIN: 7.5 g/dL (ref 6.0–8.3)
Total Bilirubin: 0.8 mg/dL (ref 0.2–1.2)

## 2014-04-01 LAB — MAGNESIUM: Magnesium: 2 mg/dL (ref 1.5–2.5)

## 2014-04-01 LAB — VITAMIN D 25 HYDROXY (VIT D DEFICIENCY, FRACTURES): Vit D, 25-Hydroxy: 34 ng/mL (ref 30–89)

## 2014-04-01 LAB — TSH: TSH: 0.919 u[IU]/mL (ref 0.350–4.500)

## 2014-04-01 LAB — INSULIN, FASTING: Insulin fasting, serum: 3.2 u[IU]/mL (ref 2.0–19.6)

## 2014-04-05 ENCOUNTER — Other Ambulatory Visit: Payer: Self-pay | Admitting: Physician Assistant

## 2014-05-20 ENCOUNTER — Ambulatory Visit (INDEPENDENT_AMBULATORY_CARE_PROVIDER_SITE_OTHER): Payer: Medicare Other | Admitting: Podiatry

## 2014-05-20 ENCOUNTER — Encounter: Payer: Self-pay | Admitting: Podiatry

## 2014-05-20 VITALS — BP 114/86 | HR 72 | Resp 12

## 2014-05-20 DIAGNOSIS — M79676 Pain in unspecified toe(s): Secondary | ICD-10-CM

## 2014-05-20 DIAGNOSIS — B351 Tinea unguium: Secondary | ICD-10-CM

## 2014-05-21 NOTE — Progress Notes (Signed)
Patient ID: Melissa Davis, female   DOB: 16-Jan-1941, 73 y.o.   MRN: [id number]  Subjective: Patient presents today complaining of painful toenails and keratoses 1  Objective: Orientated 3 The toenails are elongated, incurvated, discolored 6-10 Plantar keratoses second MPJ left  Assessment: Symptomatic mycotic toenails 6-10 Keratoses 1  Plan: Debridement toenails 10 keratoses 1 without any bleeding  Reappoint 3 months

## 2014-06-02 ENCOUNTER — Other Ambulatory Visit: Payer: Self-pay | Admitting: Physician Assistant

## 2014-07-03 ENCOUNTER — Ambulatory Visit (INDEPENDENT_AMBULATORY_CARE_PROVIDER_SITE_OTHER): Payer: Medicare Other | Admitting: Physician Assistant

## 2014-07-03 VITALS — BP 102/62 | HR 64 | Temp 97.7°F | Resp 16 | Ht 63.0 in | Wt 115.0 lb

## 2014-07-03 DIAGNOSIS — M8580 Other specified disorders of bone density and structure, unspecified site: Secondary | ICD-10-CM

## 2014-07-03 DIAGNOSIS — Z79899 Other long term (current) drug therapy: Secondary | ICD-10-CM

## 2014-07-03 DIAGNOSIS — E785 Hyperlipidemia, unspecified: Secondary | ICD-10-CM

## 2014-07-03 DIAGNOSIS — K219 Gastro-esophageal reflux disease without esophagitis: Secondary | ICD-10-CM

## 2014-07-03 DIAGNOSIS — H409 Unspecified glaucoma: Secondary | ICD-10-CM

## 2014-07-03 DIAGNOSIS — F329 Major depressive disorder, single episode, unspecified: Secondary | ICD-10-CM

## 2014-07-03 DIAGNOSIS — F32A Depression, unspecified: Secondary | ICD-10-CM

## 2014-07-03 DIAGNOSIS — E559 Vitamin D deficiency, unspecified: Secondary | ICD-10-CM

## 2014-07-03 DIAGNOSIS — F411 Generalized anxiety disorder: Secondary | ICD-10-CM

## 2014-07-03 DIAGNOSIS — E119 Type 2 diabetes mellitus without complications: Secondary | ICD-10-CM

## 2014-07-03 DIAGNOSIS — Z23 Encounter for immunization: Secondary | ICD-10-CM

## 2014-07-03 DIAGNOSIS — R0789 Other chest pain: Secondary | ICD-10-CM

## 2014-07-03 DIAGNOSIS — I10 Essential (primary) hypertension: Secondary | ICD-10-CM

## 2014-07-03 LAB — BASIC METABOLIC PANEL WITH GFR
BUN: 15 mg/dL (ref 6–23)
CO2: 29 mEq/L (ref 19–32)
CREATININE: 0.63 mg/dL (ref 0.50–1.10)
Calcium: 9.4 mg/dL (ref 8.4–10.5)
Chloride: 104 mEq/L (ref 96–112)
GFR, Est Non African American: >89 mL/min
Glucose, Bld: 87 mg/dL (ref 70–99)
Potassium: 4.2 mEq/L (ref 3.5–5.3)
Sodium: 139 mEq/L (ref 135–145)

## 2014-07-03 LAB — CBC WITH DIFFERENTIAL/PLATELET
BASOS ABS: 0.1 10*3/uL (ref 0.0–0.1)
BASOS PCT: 1 % (ref 0–1)
EOS PCT: 1 % (ref 0–5)
Eosinophils Absolute: 0.1 10*3/uL (ref 0.0–0.7)
HEMATOCRIT: 40.2 % (ref 36.0–46.0)
Hemoglobin: 13.5 g/dL (ref 12.0–15.0)
Lymphocytes Relative: 28 % (ref 12–46)
Lymphs Abs: 1.8 10*3/uL (ref 0.7–4.0)
MCH: 30.8 pg (ref 26.0–34.0)
MCHC: 33.6 g/dL (ref 30.0–36.0)
MCV: 91.8 fL (ref 78.0–100.0)
MONO ABS: 0.5 10*3/uL (ref 0.1–1.0)
MONOS PCT: 8 % (ref 3–12)
MPV: 9.7 fL (ref 9.4–12.4)
NEUTROS ABS: 4.1 10*3/uL (ref 1.7–7.7)
Neutrophils Relative %: 62 % (ref 43–77)
PLATELETS: 255 10*3/uL (ref 150–400)
RBC: 4.38 MIL/uL (ref 3.87–5.11)
RDW: 13.1 % (ref 11.5–15.5)
WBC: 6.6 10*3/uL (ref 4.0–10.5)

## 2014-07-03 LAB — HEPATIC FUNCTION PANEL
ALBUMIN: 4.4 g/dL (ref 3.5–5.2)
ALK PHOS: 80 U/L (ref 39–117)
ALT: 16 U/L (ref 0–35)
AST: 20 U/L (ref 0–37)
BILIRUBIN INDIRECT: 0.7 mg/dL (ref 0.2–1.2)
Bilirubin, Direct: 0.2 mg/dL (ref 0.0–0.3)
TOTAL PROTEIN: 7.3 g/dL (ref 6.0–8.3)
Total Bilirubin: 0.9 mg/dL (ref 0.2–1.2)

## 2014-07-03 LAB — LIPID PANEL
CHOL/HDL RATIO: 2.7 ratio
Cholesterol: 175 mg/dL (ref 0–200)
HDL: 65 mg/dL (ref >39)
LDL Cholesterol: 95 mg/dL (ref 0–99)
TRIGLYCERIDES: 73 mg/dL (ref <150)
VLDL: 15 mg/dL (ref 0–40)

## 2014-07-03 LAB — MAGNESIUM: Magnesium: 1.9 mg/dL (ref 1.5–2.5)

## 2014-07-03 LAB — TSH: TSH: 0.706 u[IU]/mL (ref 0.350–4.500)

## 2014-07-03 LAB — HEMOGLOBIN A1C
Hgb A1c MFr Bld: 6 % — ABNORMAL HIGH (ref <5.7)
Mean Plasma Glucose: 126 mg/dL — ABNORMAL HIGH (ref <117)

## 2014-07-03 NOTE — Progress Notes (Signed)
Complete Physical  Assessment and Plan: 1. Essential hypertension - continue medications, long discussion about hypotension, may need to cut norvasc in half or decrease atenolol again, no symptoms at this time, DASH diet, exercise and monitor at home. Call if greater than 130/80.  - CBC with Differential - BASIC METABOLIC PANEL WITH GFR - Hepatic function panel - TSH - Urinalysis, Routine w reflex microscopic - Microalbumin / creatinine urine ratio - EKG 12-Lead - US, RETROPERITNL ABD,  LTD  2. Gastroesophageal reflux disease without esophagitis Continue PPI/H2 blocker, diet discussed  3. Hyperlipidemia -continue medications, check lipids, decrease fatty foods, increase activity.  - Lipid panel  4. Diabetes mellitus without complication Discussed general issues about diabetes pathophysiology and management., Educational material distributed., Suggested low cholesterol diet., Encouraged aerobic exercise., Discussed foot care., Reminded to get yearly retinal exam. - Hemoglobin A1c - Insulin, fasting - HM DIABETES FOOT EXAM  5. Depression Depression- continue medications, stress management techniques discussed, increase water, good sleep hygiene discussed, increase exercise, and increase veggies.   6. Glaucoma Continue follow up with eye doctor  7. Vitamin D deficiency Continue supplement, with osteopenia discussed needs 5000 IU daily or will send in RX - Vit D  25 hydroxy (rtn osteoporosis monitoring)  8. Medication management - Magnesium  9. Osteopenia Osteopenia- get dexa 2 years, continue Vit D and Ca, weight bearing exercises  10. Generalized anxiety disorder Anxiety- continue medications, stress management techniques discussed, increase water, good sleep hygiene discussed, increase exercise, and increase veggies.   11. Chest tightness Atypical, nonexertional, no EKG changes- ? Anxiety/GERD-start on zantac OTC, declines anxiety med at this time  212. Need for  pneumococcal vaccination - Pneumococcal conjugate vaccine 13-valent IM   Discussed med's effects and SE's. Screening labs and tests as requested with regular follow-up as recommended.  HPI  73 y.o. female  presents for a complete physical.   Her blood pressure has been controlled at home, she is on atenolol 100mg  last visit it was cut in 1/2, norvasc 10mg , and accupril 40mg   today their BP is BP: 102/62 mmHg She does workout, walks and keeps grandson. She denies chest pain, shortness of breath, dizziness.  She is not on cholesterol medication and denies myalgias. Her cholesterol is at goal. The cholesterol last visit was:   Lab Results  Component Value Date   CHOL 176 03/31/2014   HDL 66 03/31/2014   LDLCALC 89 03/31/2014   TRIG 105 03/31/2014   CHOLHDL 2.7 03/31/2014    She has been working on diet and exercise for diabetes, she is not on bASA, she is on ACE, he is on metformin and denies paresthesia of the feet, polydipsia, polyuria and visual disturbances. Last A1C in the office was:  Lab Results  Component Value Date   HGBA1C 6.2* 03/31/2014   Patient is on Vitamin D supplement, only taking 1000 IU daily, she has osteopenia and we discussed the importance of vitamin D.   Lab Results  Component Value Date   VD25OH 34 03/31/2014     She has glacoma and follows closely with Dr. Dagoberto LigasStoneburner, she has very fry eye and may start on restatsis, denies dry mouth.  Gets sad this time of year due to son and husband having passed but watches her grand kids.  Some chest tightness with stress, non exertional, no accompaniments, lasts for seconds to mins.   Current Medications:  Current Outpatient Prescriptions on File Prior to Visit  Medication Sig Dispense Refill  . amLODipine (NORVASC) 10  MG tablet Take 1 tablet (10 mg total) by mouth daily. For BP 90 tablet 99  . atenolol (TENORMIN) 100 MG tablet TAKE 1 TABLET EVERY DAY 90 tablet 4  . Cholecalciferol (VITAMIN D PO) Take 1,000 Int'l  Units by mouth daily.    . clorazepate (TRANXENE) 7.5 MG tablet TAKE 1 TABLET EVERY DAY AS NEEDED 30 tablet 3  . Lancets (ONETOUCH ULTRASOFT) lancets Test once daily 100 each PRN  . metFORMIN (GLUCOPHAGE-XR) 500 MG 24 hr tablet Take 1 tablet (500 mg total) by mouth daily with breakfast. For Diabetes 90 tablet 99  . ONE TOUCH ULTRA TEST test strip Test 2 to 3 times daily 100 each PRN  . quinapril (ACCUPRIL) 40 MG tablet TAKE 1 TABLET EVERY DAY 90 tablet 1  . TRAVATAN Z 0.004 % SOLN ophthalmic solution Place 1 drop into both eyes at bedtime.      No current facility-administered medications on file prior to visit.   Health Maintenance:   Immunization History  Administered Date(s) Administered  . Influenza Split 05/01/2013  . Influenza, High Dose Seasonal PF 03/31/2014  . Pneumococcal Polysaccharide-23 08/05/1998  . Td 06/17/2012   Tetanus: 2013  Pneumovax: 2000  Prevnar DUE Flu vaccine: 05/01/2014  Zostavax: check price Pap: 2010 neg declines another MGM: 12/2013 DEXA: 12/2013 + osteopenia Colonoscopy: 2011 due 10 years  EGD: N/A DEE: 04/2014 Cath: 2010 normal EF 60%:  Patient Care Team: Lucky CowboyWilliam McKeown, MD as PCP - General (Internal Medicine) Richarda OverlieSara E Stoneburner, MD as Consulting Physician (Ophthalmology) Hart Carwinora M Brodie, MD as Consulting Physician (Gastroenterology) Santiago Bumpersichard Tuchman, DPM as Consulting Physician (Podiatry)  Allergies:  Allergies  Allergen Reactions  . Buspirone   . Clonidine Derivatives   . Cymbalta [Duloxetine Hcl]   . Elavil [Amitriptyline]   . Hytrin [Terazosin]   . Nizofenone   . Paxil [Paroxetine Hcl]   . Prozac [Fluoxetine Hcl]   . Zoloft [Sertraline Hcl]    Medical History:  Past Medical History  Diagnosis Date  . Hyperlipidemia   . Hypertension   . Vitamin D deficiency   . GERD (gastroesophageal reflux disease)   . Type II or unspecified type diabetes mellitus without mention of complication, not stated as uncontrolled   . Depression     Surgical History:  Past Surgical History  Procedure Laterality Date  . Cholecystectomy    . Total abdominal hysterectomy w/ bilateral salpingoophorectomy  1990   Family History:  Family History  Problem Relation Age of Onset  . Hypertension Mother   . Heart attack Mother   . Ulcers Mother   . CVA Father   . Alzheimer's disease Father   . Hypertension Father    Social History:  History  Substance Use Topics  . Smoking status: Never Smoker   . Smokeless tobacco: Not on file  . Alcohol Use: No   Review of Systems: Review of Systems  Constitutional: Negative.   HENT: Negative.   Eyes: Negative.   Respiratory: Negative.   Cardiovascular: Positive for chest pain. Negative for palpitations, orthopnea, claudication, leg swelling and PND.  Gastrointestinal: Positive for nausea (in the morning, better with food). Negative for heartburn, vomiting, abdominal pain, diarrhea, constipation, blood in stool and melena.  Genitourinary: Negative.        Occ incontinence if she drinks caffeine.   Musculoskeletal: Negative.  Negative for falls.  Skin: Negative.   Neurological: Negative.   Endo/Heme/Allergies: Negative.   Psychiatric/Behavioral: Positive for depression. Negative for suicidal ideas, hallucinations, memory loss and substance  abuse. The patient is nervous/anxious. The patient does not have insomnia.     Physical Exam: Estimated body mass index is 20.38 kg/(m^2) as calculated from the following:   Height as of this encounter: 5\' 3"  (1.6 m).   Weight as of this encounter: 115 lb (52.164 kg). BP 102/62 mmHg  Pulse 64  Temp(Src) 97.7 F (36.5 C)  Resp 16  Ht 5\' 3"  (1.6 m)  Wt 115 lb (52.164 kg)  BMI 20.38 kg/m2 General Appearance: Well nourished, in no apparent distress.  Eyes: PERRLA, EOMs, conjunctiva no swelling or erythema, normal fundi and vessels.  Sinuses: No Frontal/maxillary tenderness  ENT/Mouth: Ext aud canals clear, normal light reflex with TMs without  erythema, bulging. Good dentition. No erythema, swelling, or exudate on post pharynx. Tonsils not swollen or erythematous. Hearing normal.  Neck: Supple, thyroid normal. No bruits  Respiratory: Respiratory effort normal, BS equal bilaterally without rales, rhonchi, wheezing or stridor.  Cardio: RRR without murmurs, rubs or gallops. Brisk peripheral pulses without edema.  Chest: symmetric, with normal excursions and percussion.  Breasts: declines Abdomen: Soft, nontender, no guarding, rebound, hernias, masses, or organomegaly. .  Lymphatics: Non tender without lymphadenopathy.  Genitourinary: defer Musculoskeletal: Full ROM all peripheral extremities,5/5 strength, and normal gait.  Skin: Several seb keratosis. Warm, dry without rashes, lesions, ecchymosis. Neuro: Cranial nerves intact, reflexes equal bilaterally. Normal muscle tone, no cerebellar symptoms. Sensation intact.  Psych: Awake and oriented X 3, normal affect, Insight and Judgment appropriate.   EKG: sinus brady 52, possible LAE,  No ST changes. AORTA SCAN: WNL    Quentin Mulling 9:47 AM [facility]

## 2014-07-03 NOTE — Patient Instructions (Addendum)
Your Vitamin D is not in range, we want that around 60-80. Please make sure that you are taking your Vitamin D as directed. It is very important as a natural antiinflammatory helping hair, skin, and nails, as well as reducing stroke and heart attack risk. It helps your bones and helps with mood. It also decreases numerous cancer risks so please take it as directed. VERY important for your bones since you have osteopenia, suggest 5000 IU DAILY.   Add ENTERIC COATED low dose 81 mg Aspirin daily OR can do every other day if you have easy bruising to protect your heartt and head.   Please monitor your blood pressure, as we get older our body can not respond to a low blood pressure as well as it did when we were younger, for this reason we want a bit higher of a blood pressure as you get older to avoid dizziness and fatigue which can lead to falls, if you start to get dizzy please cut the norvasc 10mg  you are on in half and monitor your BP. Pease call if your blood pressure is consistently above 150/90.  Preventative Care for Adults - Female      MAINTAIN REGULAR HEALTH EXAMS:  A routine yearly physical is a good way to check in with your primary care provider about your health and preventive screening. It is also an opportunity to share updates about your health and any concerns you have, and receive a thorough all-over exam.   Most health insurance companies pay for at least some preventative services.  Check with your health plan for specific coverages.  WHAT PREVENTATIVE SERVICES DO WOMEN NEED?  Adult women should have their weight and blood pressure checked regularly.   Women age 72 and older should have their cholesterol levels checked regularly.  Women should be screened for cervical cancer with a Pap smear and pelvic exam beginning at either age 621, or 3 years after they become sexually activity.    Breast cancer screening generally begins at age 72 with a mammogram and breast exam by your  primary care provider.    Beginning at age 72 and continuing to age 275, women should be screened for colorectal cancer.  Certain people may need continued testing until age 72.  Updating vaccinations is part of preventative care.  Vaccinations help protect against diseases such as the flu.  Osteoporosis is a disease in which the bones lose minerals and strength as we age. Women ages 3565 and over should discuss this with their caregivers, as should women after menopause who have other risk factors.  Lab tests are generally done as part of preventative care to screen for anemia and blood disorders, to screen for problems with the kidneys and liver, to screen for bladder problems, to check blood sugar, and to check your cholesterol level.  Preventative services generally include counseling about diet, exercise, avoiding tobacco, drugs, excessive alcohol consumption, and sexually transmitted infections.    GENERAL RECOMMENDATIONS FOR GOOD HEALTH:  Healthy diet:  Eat a variety of foods, including fruit, vegetables, animal or vegetable protein, such as meat, fish, chicken, and eggs, or beans, lentils, tofu, and grains, such as rice.  Drink plenty of water daily.  Decrease saturated fat in the diet, avoid lots of red meat, processed foods, sweets, fast foods, and fried foods.  Exercise:  Aerobic exercise helps maintain good heart health. At least 30-40 minutes of moderate-intensity exercise is recommended. For example, a brisk walk that increases your heart rate  and breathing. This should be done on most days of the week.   Find a type of exercise or a variety of exercises that you enjoy so that it becomes a part of your daily life.  Examples are running, walking, swimming, water aerobics, and biking.  For motivation and support, explore group exercise such as aerobic class, spin class, Zumba, Yoga,or  martial arts, etc.    Set exercise goals for yourself, such as a certain weight goal, walk or  run in a race such as a 5k walk/run.  Speak to your primary care provider about exercise goals.  Disease prevention:  If you smoke or chew tobacco, find out from your caregiver how to quit. It can literally save your life, no matter how long you have been a tobacco user. If you do not use tobacco, never begin.   Maintain a healthy diet and normal weight. Increased weight leads to problems with blood pressure and diabetes.   The Body Mass Index or BMI is a way of measuring how much of your body is fat. Having a BMI above 27 increases the risk of heart disease, diabetes, hypertension, stroke and other problems related to obesity. Your caregiver can help determine your BMI and based on it develop an exercise and dietary program to help you achieve or maintain this important measurement at a healthful level.  High blood pressure causes heart and blood vessel problems.  Persistent high blood pressure should be treated with medicine if weight loss and exercise do not work.   Fat and cholesterol leaves deposits in your arteries that can block them. This causes heart disease and vessel disease elsewhere in your body.  If your cholesterol is found to be high, or if you have heart disease or certain other medical conditions, then you may need to have your cholesterol monitored frequently and be treated with medication.   Ask if you should have a cardiac stress test if your history suggests this. A stress test is a test done on a treadmill that looks for heart disease. This test can find disease prior to there being a problem.  Menopause can be associated with physical symptoms and risks. Hormone replacement therapy is available to decrease these. You should talk to your caregiver about whether starting or continuing to take hormones is right for you.   Osteoporosis is a disease in which the bones lose minerals and strength as we age. This can result in serious bone fractures. Risk of osteoporosis can be  identified using a bone density scan. Women ages 35 and over should discuss this with their caregivers, as should women after menopause who have other risk factors. Ask your caregiver whether you should be taking a calcium supplement and Vitamin D, to reduce the rate of osteoporosis.   Avoid drinking alcohol in excess (more than two drinks per day).  Avoid use of street drugs. Do not share needles with anyone. Ask for professional help if you need assistance or instructions on stopping the use of alcohol, cigarettes, and/or drugs.  Brush your teeth twice a day with fluoride toothpaste, and floss once a day. Good oral hygiene prevents tooth decay and gum disease. The problems can be painful, unattractive, and can cause other health problems. Visit your dentist for a routine oral and dental check up and preventive care every 6-12 months.   Look at your skin regularly.  Use a mirror to look at your back. Notify your caregivers of changes in moles, especially if there  are changes in shapes, colors, a size larger than a pencil eraser, an irregular border, or development of new moles.  Safety:  Use seatbelts 100% of the time, whether driving or as a passenger.  Use safety devices such as hearing protection if you work in environments with loud noise or significant background noise.  Use safety glasses when doing any work that could send debris in to the eyes.  Use a helmet if you ride a bike or motorcycle.  Use appropriate safety gear for contact sports.  Talk to your caregiver about gun safety.  Use sunscreen with a SPF (or skin protection factor) of 15 or greater.  Lighter skinned people are at a greater risk of skin cancer. Don't forget to also wear sunglasses in order to protect your eyes from too much damaging sunlight. Damaging sunlight can accelerate cataract formation.   Practice safe sex. Use condoms. Condoms are used for birth control and to help reduce the spread of sexually transmitted infections  (or STIs).  Some of the STIs are gonorrhea (the clap), chlamydia, syphilis, trichomonas, herpes, HPV (human papilloma virus) and HIV (human immunodeficiency virus) which causes AIDS. The herpes, HIV and HPV are viral illnesses that have no cure. These can result in disability, cancer and death.   Keep carbon monoxide and smoke detectors in your home functioning at all times. Change the batteries every 6 months or use a model that plugs into the wall.   Vaccinations:  Stay up to date with your tetanus shots and other required immunizations. You should have a booster for tetanus every 10 years. Be sure to get your flu shot every year, since 5%-20% of the [place] population comes down with the flu. The flu vaccine changes each year, so being vaccinated once is not enough. Get your shot in the fall, before the flu season peaks.   Other vaccines to consider:  Human Papilloma Virus or HPV causes cancer of the cervix, and other infections that can be transmitted from person to person. There is a vaccine for HPV, and females should get immunized between the ages of 15 and 59. It requires a series of 3 shots.   Pneumococcal vaccine to protect against certain types of pneumonia.  This is normally recommended for adults age 40 or older.  However, adults younger than 72 years old with certain underlying conditions such as diabetes, heart or lung disease should also receive the vaccine.  Shingles vaccine to protect against Varicella Zoster if you are older than age 12, or younger than 72 years old with certain underlying illness.  Hepatitis A vaccine to protect against a form of infection of the liver by a virus acquired from food.  Hepatitis B vaccine to protect against a form of infection of the liver by a virus acquired from blood or body fluids, particularly if you work in health care.  If you plan to travel internationally, check with your local health department for specific vaccination  recommendations.  Cancer Screening:  Breast cancer screening is essential to preventive care for women. All women age 26 and older should perform a breast self-exam every month. At age 20 and older, women should have their caregiver complete a breast exam each year. Women at ages 33 and older should have a mammogram (x-ray film) of the breasts. Your caregiver can discuss how often you need mammograms.    Cervical cancer screening includes taking a Pap smear (sample of cells examined under a microscope) from the cervix (end of  the uterus). It also includes testing for HPV (Human Papilloma Virus, which can cause cervical cancer). Screening and a pelvic exam should begin at age 121, or 3 years after a woman becomes sexually active. Screening should occur every year, with a Pap smear but no HPV testing, up to age 72. After age 72, you should have a Pap smear every 3 years with HPV testing, if no HPV was found previously.   Most routine colon cancer screening begins at the age of 72. On a yearly basis, doctors may provide special easy to use take-home tests to check for hidden blood in the stool. Sigmoidoscopy or colonoscopy can detect the earliest forms of colon cancer and is life saving. These tests use a small camera at the end of a tube to directly examine the colon. Speak to your caregiver about this at age 72, when routine screening begins (and is repeated every 5 years unless early forms of pre-cancerous polyps or small growths are found).

## 2014-07-04 LAB — MICROALBUMIN / CREATININE URINE RATIO
CREATININE, URINE: 33.1 mg/dL
Microalb Creat Ratio: 24.2 mg/g (ref 0.0–30.0)
Microalb, Ur: 0.8 mg/dL (ref <2.0)

## 2014-07-04 LAB — URINALYSIS, ROUTINE W REFLEX MICROSCOPIC
Bilirubin Urine: NEGATIVE
Glucose, UA: NEGATIVE mg/dL
Hgb urine dipstick: NEGATIVE
KETONES UR: NEGATIVE mg/dL
NITRITE: NEGATIVE
Protein, ur: NEGATIVE mg/dL
SPECIFIC GRAVITY, URINE: 1.007 (ref 1.005–1.030)
Urobilinogen, UA: 0.2 mg/dL (ref 0.0–1.0)
pH: 6.5 (ref 5.0–8.0)

## 2014-07-04 LAB — URINALYSIS, MICROSCOPIC ONLY
Casts: NONE SEEN
Crystals: NONE SEEN
Squamous Epithelial / LPF: NONE SEEN

## 2014-07-04 LAB — INSULIN, FASTING: Insulin fasting, serum: 2.6 u[IU]/mL (ref 2.0–19.6)

## 2014-07-04 LAB — VITAMIN D 25 HYDROXY (VIT D DEFICIENCY, FRACTURES): VIT D 25 HYDROXY: 25 ng/mL — AB (ref 30–100)

## 2014-08-19 ENCOUNTER — Telehealth: Payer: Self-pay

## 2014-08-19 NOTE — Telephone Encounter (Signed)
Received paper note from front office staff, patient wanted advice as she is constipated and getting no relief with colace, states anxiety is increasing , states clorazepate causes dizziness and wanted to see if we would change, per Quentin MullingAmanda Collier, PA, called patient back at number provided [phone], advised her that she can try Miralax and that she needs to schedule follow up to discuss any changes in any anxiety medications. She declined to schedule at this time, will check her schedule and call back as needed.

## 2014-08-26 ENCOUNTER — Encounter: Payer: Self-pay | Admitting: Podiatry

## 2014-08-26 ENCOUNTER — Ambulatory Visit (INDEPENDENT_AMBULATORY_CARE_PROVIDER_SITE_OTHER): Payer: Medicare Other | Admitting: Podiatry

## 2014-08-26 DIAGNOSIS — B351 Tinea unguium: Secondary | ICD-10-CM

## 2014-08-26 DIAGNOSIS — M79676 Pain in unspecified toe(s): Secondary | ICD-10-CM

## 2014-08-26 NOTE — Patient Instructions (Signed)
Diabetes and Foot Care Diabetes may cause you to have problems because of poor blood supply (circulation) to your feet and legs. This may cause the skin on your feet to become thinner, break easier, and heal more slowly. Your skin may become dry, and the skin may peel and crack. You may also have nerve damage in your legs and feet causing decreased feeling in them. You may not notice minor injuries to your feet that could lead to infections or more serious problems. Taking care of your feet is one of the most important things you can do for yourself.  HOME CARE INSTRUCTIONS  Wear shoes at all times, even in the house. Do not go barefoot. Bare feet are easily injured.  Check your feet daily for blisters, cuts, and redness. If you cannot see the bottom of your feet, use a mirror or ask someone for help.  Wash your feet with warm water (do not use hot water) and mild soap. Then pat your feet and the areas between your toes until they are completely dry. Do not soak your feet as this can dry your skin.  Apply a moisturizing lotion or petroleum jelly (that does not contain alcohol and is unscented) to the skin on your feet and to dry, brittle toenails. Do not apply lotion between your toes.  Trim your toenails straight across. Do not dig under them or around the cuticle. File the edges of your nails with an emery board or nail file.  Do not cut corns or calluses or try to remove them with medicine.  Wear clean socks or stockings every day. Make sure they are not too tight. Do not wear knee-high stockings since they may decrease blood flow to your legs.  Wear shoes that fit properly and have enough cushioning. To break in new shoes, wear them for just a few hours a day. This prevents you from injuring your feet. Always look in your shoes before you put them on to be sure there are no objects inside.  Do not cross your legs. This may decrease the blood flow to your feet.  If you find a minor scrape,  cut, or break in the skin on your feet, keep it and the skin around it clean and dry. These areas may be cleansed with mild soap and water. Do not cleanse the area with peroxide, alcohol, or iodine.  When you remove an adhesive bandage, be sure not to damage the skin around it.  If you have a wound, look at it several times a day to make sure it is healing.  Do not use heating pads or hot water bottles. They may burn your skin. If you have lost feeling in your feet or legs, you may not know it is happening until it is too late.  Make sure your health care provider performs a complete foot exam at least annually or more often if you have foot problems. Report any cuts, sores, or bruises to your health care provider immediately. SEEK MEDICAL CARE IF:   You have an injury that is not healing.  You have cuts or breaks in the skin.  You have an ingrown nail.  You notice redness on your legs or feet.  You feel burning or tingling in your legs or feet.  You have pain or cramps in your legs and feet.  Your legs or feet are numb.  Your feet always feel cold. SEEK IMMEDIATE MEDICAL CARE IF:   There is increasing redness,   swelling, or pain in or around a wound.  There is a red line that goes up your leg.  Pus is coming from a wound.  You develop a fever or as directed by your health care provider.  You notice a bad smell coming from an ulcer or wound. Document Released: 06/30/2000 Document Revised: 03/05/2013 Document Reviewed: 12/10/2012 [employer] Patient Information 2015 [employer]. This information is not intended to replace advice given to you by your health care provider. Make sure you discuss any questions you have with your health care provider.  

## 2014-08-26 NOTE — Progress Notes (Signed)
Patient ID: Melissa Davis, female   DOB: 04-21-1941, 74 y.o.   MRN: [id number]   Subjective: Patient presents complaining of painful toenails and requests debridement  Objective: The toenails are incurvated, hypertrophic, discolored and tender to palpation 6-10 Plantar keratoses sub-second MPJ left  Assessment: Symptomatic onychomycoses 6-10 Keratoses 1  Plan: Debrided toenails 10 and keratoses 1 without a bleeding  Reappoint 3 months

## 2014-10-15 ENCOUNTER — Ambulatory Visit (INDEPENDENT_AMBULATORY_CARE_PROVIDER_SITE_OTHER): Payer: Medicare Other | Admitting: Internal Medicine

## 2014-10-15 ENCOUNTER — Encounter: Payer: Self-pay | Admitting: Internal Medicine

## 2014-10-15 VITALS — BP 136/68 | HR 60 | Temp 97.3°F | Resp 16 | Ht 63.0 in | Wt 115.6 lb

## 2014-10-15 DIAGNOSIS — E785 Hyperlipidemia, unspecified: Secondary | ICD-10-CM

## 2014-10-15 DIAGNOSIS — Z79899 Other long term (current) drug therapy: Secondary | ICD-10-CM

## 2014-10-15 DIAGNOSIS — E119 Type 2 diabetes mellitus without complications: Secondary | ICD-10-CM

## 2014-10-15 DIAGNOSIS — I10 Essential (primary) hypertension: Secondary | ICD-10-CM

## 2014-10-15 DIAGNOSIS — E559 Vitamin D deficiency, unspecified: Secondary | ICD-10-CM

## 2014-10-15 LAB — LIPID PANEL
CHOL/HDL RATIO: 3.4 ratio
Cholesterol: 182 mg/dL (ref 0–200)
HDL: 54 mg/dL (ref >=46)
LDL Cholesterol: 102 mg/dL — ABNORMAL HIGH (ref 0–99)
Triglycerides: 128 mg/dL (ref <150)
VLDL: 26 mg/dL (ref 0–40)

## 2014-10-15 LAB — HEPATIC FUNCTION PANEL
ALK PHOS: 72 U/L (ref 39–117)
ALT: 10 U/L (ref 0–35)
AST: 17 U/L (ref 0–37)
Albumin: 4.4 g/dL (ref 3.5–5.2)
Bilirubin, Direct: 0.1 mg/dL (ref 0.0–0.3)
Indirect Bilirubin: 0.5 mg/dL (ref 0.2–1.2)
TOTAL PROTEIN: 7 g/dL (ref 6.0–8.3)
Total Bilirubin: 0.6 mg/dL (ref 0.2–1.2)

## 2014-10-15 LAB — TSH: TSH: 0.99 u[IU]/mL (ref 0.350–4.500)

## 2014-10-15 LAB — BASIC METABOLIC PANEL WITH GFR
BUN: 21 mg/dL (ref 6–23)
CHLORIDE: 103 meq/L (ref 96–112)
CO2: 29 meq/L (ref 19–32)
CREATININE: 0.65 mg/dL (ref 0.50–1.10)
Calcium: 9.3 mg/dL (ref 8.4–10.5)
GFR, Est African American: >89 mL/min
GFR, Est Non African American: 89 mL/min
Glucose, Bld: 85 mg/dL (ref 70–99)
Potassium: 4.4 mEq/L (ref 3.5–5.3)
Sodium: 141 mEq/L (ref 135–145)

## 2014-10-15 LAB — CBC WITH DIFFERENTIAL/PLATELET
BASOS PCT: 1 % (ref 0–1)
Basophils Absolute: 0.1 10*3/uL (ref 0.0–0.1)
EOS ABS: 0.1 10*3/uL (ref 0.0–0.7)
Eosinophils Relative: 1 % (ref 0–5)
HCT: 39.1 % (ref 36.0–46.0)
Hemoglobin: 13.4 g/dL (ref 12.0–15.0)
Lymphocytes Relative: 23 % (ref 12–46)
Lymphs Abs: 1.7 10*3/uL (ref 0.7–4.0)
MCH: 30.8 pg (ref 26.0–34.0)
MCHC: 34.3 g/dL (ref 30.0–36.0)
MCV: 89.9 fL (ref 78.0–100.0)
MONOS PCT: 9 % (ref 3–12)
MPV: 9.6 fL (ref 8.6–12.4)
Monocytes Absolute: 0.6 10*3/uL (ref 0.1–1.0)
Neutro Abs: 4.8 10*3/uL (ref 1.7–7.7)
Neutrophils Relative %: 66 % (ref 43–77)
Platelets: 252 10*3/uL (ref 150–400)
RBC: 4.35 MIL/uL (ref 3.87–5.11)
RDW: 13.1 % (ref 11.5–15.5)
WBC: 7.2 10*3/uL (ref 4.0–10.5)

## 2014-10-15 LAB — INSULIN, RANDOM: Insulin: 5.7 u[IU]/mL (ref 2.0–19.6)

## 2014-10-15 LAB — HEMOGLOBIN A1C
HEMOGLOBIN A1C: 6.4 % — AB (ref <5.7)
Mean Plasma Glucose: 137 mg/dL — ABNORMAL HIGH (ref <117)

## 2014-10-15 LAB — MAGNESIUM: MAGNESIUM: 2 mg/dL (ref 1.5–2.5)

## 2014-10-15 MED ORDER — CLORAZEPATE DIPOTASSIUM 7.5 MG PO TABS: ORAL_TABLET | ORAL | Status: AC

## 2014-10-15 NOTE — Patient Instructions (Signed)

## 2014-10-15 NOTE — Progress Notes (Signed)
Patient ID: Melissa Davis, female   DOB: [birth year], 74 y.o.   MRN: [id number]   [facility] 74 [facility] month follow up with Hypertension, Hyperlipidemia, T2_NIDDM and Vitamin D Deficiency. She does report being under a lot of personal family stresses in Jan & Feb with consequent constipation - now improved with Miralax & Colace.   Patient is treated for HTN since 1994 & BP has been controlled at home. Today's BP: 136/68 mmHg. Patient has had no complaints of any cardiac type chest pain, palpitations, dyspnea/orthopnea/PND, dizziness, claudication, or dependent edema.   Hyperlipidemia is controlled with diet & meds. Patient denies myalgias or other med SE's. Last Lipids were at goal -  Total Chol 182; HDL 54; LDL 102; Trig 128 on 10/15/2014.   Also, the patient has history of T2_NIDDM since 2004 and has had no symptoms of reactive hypoglycemia, diabetic polys, paresthesias or visual blurring.She reports FBG's are in the 80-100 range.  Last A1c was  6.0% on 07/03/2014.    Further, the patient also has history of Vitamin D Deficiency and supplements vitamin D without any suspected side-effects. Last vitamin D was very low at 25 on 07/03/2014.  Medication Sig  . amLODipine 10 MG tablet Take 1 tablet (10 mg total) by mouth daily. For BP  . atenolol 100 MG tablet TAKE 1 TABLET EVERY DAY  . VITAMIN D  Take 1,000 Int'l Units by mouth daily.  . clorazepate 7.5 MG tablet TAKE 1 TABLET EVERY DAY AS NEEDED  . metFORMIN -XR 500 MG  Take 1 tablet (500 mg total) by mouth daily with breakfast. For Diabetes  . quinapril  40 MG tablet TAKE 1 TABLET EVERY DAY  . TRAVATAN Z 0.004 % SOLN  Place 1 drop into both eyes at bedtime.    Allergies  Allergen Reactions  . Buspirone   . Clonidine Derivatives   . Cymbalta [Duloxetine Hcl]   . Elavil [Amitriptyline]   . Hytrin [Terazosin]   . Nizofenone   . Paxil [Paroxetine Hcl]   . Prozac [Fluoxetine Hcl]   . Zoloft [Sertraline Hcl]    PMHx:    Past Medical History  Diagnosis Date  . Hyperlipidemia   . Hypertension   . Vitamin D deficiency   . GERD (gastroesophageal reflux disease)   . Type II or unspecified type diabetes mellitus without mention of complication, not stated as uncontrolled   . Depression    Immunization History  Administered Date(s) Administered  . Influenza Split 05/01/2013  . Influenza, High Dose Seasonal PF 03/31/2014  . Pneumococcal Conjugate-13 07/03/2014  . Pneumococcal Polysaccharide-23 08/05/1998  . Td 06/17/2012   Past Surgical History  Procedure Laterality Date  . Cholecystectomy    . Total abdominal hysterectomy w/ bilateral salpingoophorectomy  1990   FHx:    Reviewed / unchanged  SHx:    Reviewed / unchanged  Systems Review:  Constitutional: Denies fever, chills, wt changes, headaches, insomnia, fatigue, night sweats, change in appetite. Eyes: Denies redness, blurred vision, diplopia, discharge, itchy, watery eyes.  ENT: Denies discharge, congestion, post nasal drip, epistaxis, sore throat, earache, hearing loss, dental pain, tinnitus, vertigo, sinus pain, snoring.  CV: Denies chest pain, palpitations, irregular heartbeat, syncope, dyspnea, diaphoresis, orthopnea, PND, claudication or edema. Respiratory: denies cough, dyspnea, DOE, pleurisy, hoarseness, laryngitis, wheezing.  Gastrointestinal: Denies dysphagia, odynophagia, heartburn, reflux, water brash, abdominal pain or cramps, nausea, vomiting, bloating, diarrhea, constipation, hematemesis, melena, hematochezia  or hemorrhoids. Genitourinary: Denies dysuria, frequency, urgency, nocturia, hesitancy,  discharge, hematuria or flank pain. Musculoskeletal: Denies arthralgias, myalgias, stiffness, jt. swelling, pain, limping or strain/sprain.  Skin: Denies pruritus, rash, hives, warts, acne, eczema or change in skin lesion(s). Neuro: No weakness, tremor, incoordination, spasms, paresthesia or pain. Psychiatric: Denies confusion, memory loss  or sensory loss. Endo: Denies change in weight, skin or hair change.  Heme/Lymph: No excessive bleeding, bruising or enlarged lymph nodes.  Physical Exam  BP 136/68   Pulse 60  Temp 97.3 F   Resp 16  Ht    Wt 115 lb 9.6 oz     BMI 20.48   Appears well nourished and in no distress. Eyes: PERRLA, EOMs, conjunctiva no swelling or erythema. Sinuses: No frontal/maxillary tenderness ENT/Mouth: EAC's clear, TM's nl w/o erythema, bulging. Nares clear w/o erythema, swelling, exudates. Oropharynx clear without erythema or exudates. Oral hygiene is good. Tongue normal, non obstructing. Hearing intact.  Neck: Supple. Thyroid nl. Car 2+/2+ without bruits, nodes or JVD. Chest: Respirations nl with BS clear & equal w/o rales, rhonchi, wheezing or stridor.  Cor: Heart sounds normal w/ regular rate and rhythm without sig. murmurs, gallops, clicks, or rubs. Peripheral pulses normal and equal  without edema.  Abdomen: Soft & bowel sounds normal. Non-tender w/o guarding, rebound, hernias, masses, or organomegaly.  Lymphatics: Unremarkable.  Musculoskeletal: Full ROM all peripheral extremities, joint stability, 5/5 strength, and normal gait.  Skin: Warm, dry without exposed rashes, lesions or ecchymosis apparent.  Neuro: Cranial nerves intact, reflexes equal bilaterally. Sensory-motor testing grossly intact. Tendon reflexes grossly intact.  Pysch: Alert & oriented x 3.  Insight and judgement nl & appropriate. No ideations.  Assessment and Plan:  1. Essential hypertension  - TSH - Continue monitor blood pressure at home. Continue diet/meds same.  2. Hyperlipidemia  - Lipid panel - Continue diet/meds, exercise,& lifestyle modifications. Continue monitor periodic cholesterol/liver & renal functions   3. T2_NIDDM  - Hemoglobin A1c - Insulin, random - Continue diet, exercise, lifestyle modifications. Monitor appropriate labs.  4. Vitamin D deficiency  - Vit D  25 hydroxy (rtn osteoporosis  monitoring) - Continue supplementation.  5. Medication management  - CBC with Differential/Platelet - BASIC METABOLIC PANEL WITH GFR - Hepatic function panel - Magnesium   Recommended regular exercise, BP monitoring, weight control, and discussed med and SE's. Recommended labs to assess and monitor clinical status. Further disposition pending results of labs. Over 30 minutes of exam, counseling, chart review was performed

## 2014-10-16 LAB — VITAMIN D 25 HYDROXY (VIT D DEFICIENCY, FRACTURES): Vit D, 25-Hydroxy: 26 ng/mL — ABNORMAL LOW (ref 30–100)

## 2014-10-18 ENCOUNTER — Other Ambulatory Visit: Payer: Self-pay | Admitting: Internal Medicine

## 2014-10-18 DIAGNOSIS — E119 Type 2 diabetes mellitus without complications: Secondary | ICD-10-CM

## 2014-10-18 MED ORDER — METFORMIN HCL ER 500 MG PO TB24: ORAL_TABLET | ORAL | Status: DC

## 2014-10-21 ENCOUNTER — Other Ambulatory Visit: Payer: Self-pay | Admitting: Internal Medicine

## 2014-11-19 ENCOUNTER — Other Ambulatory Visit: Payer: Self-pay | Admitting: Physician Assistant

## 2014-11-25 ENCOUNTER — Ambulatory Visit (INDEPENDENT_AMBULATORY_CARE_PROVIDER_SITE_OTHER): Payer: Medicare Other | Admitting: Podiatry

## 2014-11-25 ENCOUNTER — Encounter: Payer: Self-pay | Admitting: Podiatry

## 2014-11-25 DIAGNOSIS — M79676 Pain in unspecified toe(s): Secondary | ICD-10-CM | POA: Diagnosis not present

## 2014-11-25 DIAGNOSIS — B351 Tinea unguium: Secondary | ICD-10-CM | POA: Diagnosis not present

## 2014-11-25 NOTE — Patient Instructions (Signed)
Diabetes and Foot Care Diabetes may cause you to have problems because of poor blood supply (circulation) to your feet and legs. This may cause the skin on your feet to become thinner, break easier, and heal more slowly. Your skin may become dry, and the skin may peel and crack. You may also have nerve damage in your legs and feet causing decreased feeling in them. You may not notice minor injuries to your feet that could lead to infections or more serious problems. Taking care of your feet is one of the most important things you can do for yourself.  HOME CARE INSTRUCTIONS  Wear shoes at all times, even in the house. Do not go barefoot. Bare feet are easily injured.  Check your feet daily for blisters, cuts, and redness. If you cannot see the bottom of your feet, use a mirror or ask someone for help.  Wash your feet with warm water (do not use hot water) and mild soap. Then pat your feet and the areas between your toes until they are completely dry. Do not soak your feet as this can dry your skin.  Apply a moisturizing lotion or petroleum jelly (that does not contain alcohol and is unscented) to the skin on your feet and to dry, brittle toenails. Do not apply lotion between your toes.  Trim your toenails straight across. Do not dig under them or around the cuticle. File the edges of your nails with an emery board or nail file.  Do not cut corns or calluses or try to remove them with medicine.  Wear clean socks or stockings every day. Make sure they are not too tight. Do not wear knee-high stockings since they may decrease blood flow to your legs.  Wear shoes that fit properly and have enough cushioning. To break in new shoes, wear them for just a few hours a day. This prevents you from injuring your feet. Always look in your shoes before you put them on to be sure there are no objects inside.  Do not cross your legs. This may decrease the blood flow to your feet.  If you find a minor scrape,  cut, or break in the skin on your feet, keep it and the skin around it clean and dry. These areas may be cleansed with mild soap and water. Do not cleanse the area with peroxide, alcohol, or iodine.  When you remove an adhesive bandage, be sure not to damage the skin around it.  If you have a wound, look at it several times a day to make sure it is healing.  Do not use heating pads or hot water bottles. They may burn your skin. If you have lost feeling in your feet or legs, you may not know it is happening until it is too late.  Make sure your health care provider performs a complete foot exam at least annually or more often if you have foot problems. Report any cuts, sores, or bruises to your health care provider immediately. SEEK MEDICAL CARE IF:   You have an injury that is not healing.  You have cuts or breaks in the skin.  You have an ingrown nail.  You notice redness on your legs or feet.  You feel burning or tingling in your legs or feet.  You have pain or cramps in your legs and feet.  Your legs or feet are numb.  Your feet always feel cold. SEEK IMMEDIATE MEDICAL CARE IF:   There is increasing redness,   swelling, or pain in or around a wound.  There is a red line that goes up your leg.  Pus is coming from a wound.  You develop a fever or as directed by your health care provider.  You notice a bad smell coming from an ulcer or wound. Document Released: 06/30/2000 Document Revised: 03/05/2013 Document Reviewed: 12/10/2012 [employer] Patient Information 2015 [employer]. This information is not intended to replace advice given to you by your health care provider. Make sure you discuss any questions you have with your health care provider.  

## 2014-11-27 NOTE — Progress Notes (Signed)
Patient ID: Melissa Davis, female   DOB: 07-07-1941, 74 y.o.   MRN: [id number]  Subjective: This patient presents complaining of uncomfortable toenails her request nail debridement  Objective: The toenails are elongated, hypertrophic, discolored and tender to direct palpation 6-10   Assessment: Symptomatic onychomycosis 6-10 Type II diabetic  Plan: Debridement of toenails 10 without any bleeding  Reappoint 3 month

## 2014-12-30 ENCOUNTER — Other Ambulatory Visit: Payer: Self-pay | Admitting: *Deleted

## 2014-12-30 MED ORDER — ONETOUCH ULTRASOFT LANCETS MISC: Status: AC

## 2014-12-30 MED ORDER — GLUCOSE BLOOD VI STRP: ORAL_STRIP | Status: DC

## 2014-12-30 MED ORDER — ONETOUCH ULTRA SYSTEM W/DEVICE KIT: 1.0000 | PACK | Freq: Once | Status: AC

## 2015-01-06 ENCOUNTER — Telehealth: Payer: Self-pay | Admitting: *Deleted

## 2015-01-06 NOTE — Telephone Encounter (Signed)
Left message on the machine at [facility] advising the correct dx code for patient's diabetic supplies is E11.9.  The code had been faxed to them also.

## 2015-01-19 ENCOUNTER — Encounter: Payer: Self-pay | Admitting: Physician Assistant

## 2015-01-19 ENCOUNTER — Ambulatory Visit (INDEPENDENT_AMBULATORY_CARE_PROVIDER_SITE_OTHER): Payer: Medicare Other | Admitting: Physician Assistant

## 2015-01-19 VITALS — BP 138/70 | HR 56 | Temp 97.7°F | Resp 16 | Ht 63.0 in | Wt 114.0 lb

## 2015-01-19 DIAGNOSIS — Z0000 Encounter for general adult medical examination without abnormal findings: Secondary | ICD-10-CM

## 2015-01-19 DIAGNOSIS — F32A Depression, unspecified: Secondary | ICD-10-CM

## 2015-01-19 DIAGNOSIS — E119 Type 2 diabetes mellitus without complications: Secondary | ICD-10-CM

## 2015-01-19 DIAGNOSIS — F329 Major depressive disorder, single episode, unspecified: Secondary | ICD-10-CM

## 2015-01-19 DIAGNOSIS — E559 Vitamin D deficiency, unspecified: Secondary | ICD-10-CM

## 2015-01-19 DIAGNOSIS — M8580 Other specified disorders of bone density and structure, unspecified site: Secondary | ICD-10-CM

## 2015-01-19 DIAGNOSIS — Z79899 Other long term (current) drug therapy: Secondary | ICD-10-CM

## 2015-01-19 DIAGNOSIS — Z6820 Body mass index (BMI) 20.0-20.9, adult: Secondary | ICD-10-CM

## 2015-01-19 DIAGNOSIS — R351 Nocturia: Secondary | ICD-10-CM

## 2015-01-19 DIAGNOSIS — R6889 Other general symptoms and signs: Secondary | ICD-10-CM

## 2015-01-19 DIAGNOSIS — H409 Unspecified glaucoma: Secondary | ICD-10-CM

## 2015-01-19 DIAGNOSIS — Z0001 Encounter for general adult medical examination with abnormal findings: Secondary | ICD-10-CM

## 2015-01-19 DIAGNOSIS — I10 Essential (primary) hypertension: Secondary | ICD-10-CM

## 2015-01-19 DIAGNOSIS — Z9181 History of falling: Secondary | ICD-10-CM

## 2015-01-19 DIAGNOSIS — E785 Hyperlipidemia, unspecified: Secondary | ICD-10-CM

## 2015-01-19 DIAGNOSIS — K5900 Constipation, unspecified: Secondary | ICD-10-CM

## 2015-01-19 DIAGNOSIS — K219 Gastro-esophageal reflux disease without esophagitis: Secondary | ICD-10-CM

## 2015-01-19 LAB — CBC WITH DIFFERENTIAL/PLATELET
BASOS ABS: 0 10*3/uL (ref 0.0–0.1)
Basophils Relative: 0 % (ref 0–1)
EOS PCT: 1 % (ref 0–5)
Eosinophils Absolute: 0.1 10*3/uL (ref 0.0–0.7)
HCT: 41.3 % (ref 36.0–46.0)
HEMOGLOBIN: 13.6 g/dL (ref 12.0–15.0)
LYMPHS ABS: 1.7 10*3/uL (ref 0.7–4.0)
Lymphocytes Relative: 23 % (ref 12–46)
MCH: 30.2 pg (ref 26.0–34.0)
MCHC: 32.9 g/dL (ref 30.0–36.0)
MCV: 91.6 fL (ref 78.0–100.0)
MPV: 10 fL (ref 8.6–12.4)
Monocytes Absolute: 0.6 10*3/uL (ref 0.1–1.0)
Monocytes Relative: 8 % (ref 3–12)
NEUTROS PCT: 68 % (ref 43–77)
Neutro Abs: 5 10*3/uL (ref 1.7–7.7)
PLATELETS: 274 10*3/uL (ref 150–400)
RBC: 4.51 MIL/uL (ref 3.87–5.11)
RDW: 13.1 % (ref 11.5–15.5)
WBC: 7.3 10*3/uL (ref 4.0–10.5)

## 2015-01-19 LAB — HEMOGLOBIN A1C
Hgb A1c MFr Bld: 6 % — ABNORMAL HIGH (ref <5.7)
MEAN PLASMA GLUCOSE: 126 mg/dL — AB (ref <117)

## 2015-01-19 MED ORDER — RANITIDINE HCL 300 MG PO TABS: 300.0000 mg | ORAL_TABLET | Freq: Every day | ORAL | Status: DC

## 2015-01-19 NOTE — Patient Instructions (Addendum)
Try a claritin or zyrtec at night Make sure you do not drink anything 3 hours before bed Look at bladder matters book  If need to can try melatonin when you wake up OR can try 1/2 of the anxiety pill.  If this does not help we can try a urination pill first at night.    Zantac at night for 1-2 weeks  Get on low dose of magnesium this can help with sleep and your constipation Or can try metformin without food which can cause diarrhea.  Benefiber is good for constipation/diarrhea/irritable bowel syndrome, it helps with weight loss and can help lower your bad cholesterol. Please do 1-2 TBSP in the morning in water, coffee, or tea. It can take up to a month before you can see a difference with your bowel movements. It is cheapest from [employer], [employer], [employer].   Encourage you to get the 3D Mammogram  The 3D Mammogram is much more specific and sensitive to pick up breast cancer. For women with fibrocystic breast or lumpy breast it can be hard to determine if it is cancer or not but the 3D mammogram is able to tell this difference which cuts back on unneeded additional tests or scary call backs.   [facility]  7 a.m.-6:30 p.m., Monday 7 a.m.-5 p.m., Tuesday-Friday Schedule an appointment by calling [phone].  Preventive Care for Adults A healthy lifestyle and preventive care can promote health and wellness. Preventive health guidelines for women include the following key practices.  A routine yearly physical is a good way to check with your health care provider about your health and preventive screening. It is a chance to share any concerns and updates on your health and to receive a thorough exam.  Visit your dentist for a routine exam and preventive care every 6 months. Brush your teeth twice a day and floss once a day. Good oral hygiene prevents tooth decay and gum disease.  The frequency of eye exams is based on your age, health, family medical history, use  of contact lenses, and other factors. Follow your health care provider's recommendations for frequency of eye exams.  Eat a healthy diet. Foods like vegetables, fruits, whole grains, low-fat dairy products, and lean protein foods contain the nutrients you need without too many calories. Decrease your intake of foods high in solid fats, added sugars, and salt. Eat the right amount of calories for you.Get information about a proper diet from your health care provider, if necessary.  Regular physical exercise is one of the most important things you can do for your health. Most adults should get at least 150 minutes of moderate-intensity exercise (any activity that increases your heart rate and causes you to sweat) each week. In addition, most adults need muscle-strengthening exercises on 2 or more days a week.  Maintain a healthy weight. The body mass index (BMI) is a screening tool to identify possible weight problems. It provides an estimate of body fat based on height and weight. Your health care provider can find your BMI and can help you achieve or maintain a healthy weight.For adults 20 years and older:  A BMI below 18.5 is considered underweight.  A BMI of 18.5 to 24.9 is normal.  A BMI of 25 to 29.9 is considered overweight.  A BMI of 30 and above is considered obese.  Maintain normal blood lipids and cholesterol levels by exercising and minimizing your intake of saturated fat. Eat a balanced diet with  plenty of fruit and vegetables. If your lipid or cholesterol levels are high, you are over 50, or you are at high risk for heart disease, you may need your cholesterol levels checked more frequently.Ongoing high lipid and cholesterol levels should be treated with medicines if diet and exercise are not working.  If you smoke, find out from your health care provider how to quit. If you do not use tobacco, do not start.  Lung cancer screening is recommended for adults aged 45-80 years who are  at high risk for developing lung cancer because of a history of smoking. A yearly low-dose CT scan of the lungs is recommended for people who have at least a 30-pack-year history of smoking and are a current smoker or have quit within the past 15 years. A pack year of smoking is smoking an average of 1 pack of cigarettes a day for 1 year (for example: 1 pack a day for 30 years or 2 packs a day for 15 years). Yearly screening should continue until the smoker has stopped smoking for at least 15 years. Yearly screening should be stopped for people who develop a health problem that would prevent them from having lung cancer treatment.  Avoid use of street drugs. Do not share needles with anyone. Ask for help if you need support or instructions about stopping the use of drugs.  High blood pressure causes heart disease and increases the risk of stroke.  Ongoing high blood pressure should be treated with medicines if weight loss and exercise do not work.  If you are 61-81 years old, ask your health care provider if you should take aspirin to prevent strokes.  Diabetes screening involves taking a blood sample to check your fasting blood sugar level. This should be done once every 3 years, after age 47, if you are within normal weight and without risk factors for diabetes. Testing should be considered at a younger age or be carried out more frequently if you are overweight and have at least 1 risk factor for diabetes.  Breast cancer screening is essential preventive care for women. You should practice "breast self-awareness." This means understanding the normal appearance and feel of your breasts and may include breast self-examination. Any changes detected, no matter how small, should be reported to a health care provider. Women in their 37s and 30s should have a clinical breast exam (CBE) by a health care provider as part of a regular health exam every 1 to 3 years. After age 27, women should have a CBE every  year. Starting at age 74, women should consider having a mammogram (breast X-ray test) every year. Women who have a family history of breast cancer should talk to their health care provider about genetic screening. Women at a high risk of breast cancer should talk to their health care providers about having an MRI and a mammogram every year.  Breast cancer gene (BRCA)-related cancer risk assessment is recommended for women who have family members with BRCA-related cancers. BRCA-related cancers include breast, ovarian, tubal, and peritoneal cancers. Having family members with these cancers may be associated with an increased risk for harmful changes (mutations) in the breast cancer genes BRCA1 and BRCA2. Results of the assessment will determine the need for genetic counseling and BRCA1 and BRCA2 testing.  Routine pelvic exams to screen for cancer are no longer recommended for nonpregnant women who are considered low risk for cancer of the pelvic organs (ovaries, uterus, and vagina) and who do not have  symptoms. Ask your health care provider if a screening pelvic exam is right for you.  If you have had past treatment for cervical cancer or a condition that could lead to cancer, you need Pap tests and screening for cancer for at least 20 years after your treatment. If Pap tests have been discontinued, your risk factors (such as having a new sexual partner) need to be reassessed to determine if screening should be resumed. Some women have medical problems that increase the chance of getting cervical cancer. In these cases, your health care provider may recommend more frequent screening and Pap tests.    Colorectal cancer can be detected and often prevented. Most routine colorectal cancer screening begins at the age of 41 years and continues through age 80 years. However, your health care provider may recommend screening at an earlier age if you have risk factors for colon cancer. On a yearly basis, your health  care provider may provide home test kits to check for hidden blood in the stool. Use of a small camera at the end of a tube, to directly examine the colon (sigmoidoscopy or colonoscopy), can detect the earliest forms of colorectal cancer. Talk to your health care provider about this at age 76, when routine screening begins. Direct exam of the colon should be repeated every 5-10 years through age 27 years, unless early forms of pre-cancerous polyps or small growths are found.  Osteoporosis is a disease in which the bones lose minerals and strength with aging. This can result in serious bone fractures or breaks. The risk of osteoporosis can be identified using a bone density scan. Women ages 59 years and over and women at risk for fractures or osteoporosis should discuss screening with their health care providers. Ask your health care provider whether you should take a calcium supplement or vitamin D to reduce the rate of osteoporosis.  Menopause can be associated with physical symptoms and risks. Hormone replacement therapy is available to decrease symptoms and risks. You should talk to your health care provider about whether hormone replacement therapy is right for you.  Use sunscreen. Apply sunscreen liberally and repeatedly throughout the day. You should seek shade when your shadow is shorter than you. Protect yourself by wearing long sleeves, pants, a wide-brimmed hat, and sunglasses year round, whenever you are outdoors.  Once a month, do a whole body skin exam, using a mirror to look at the skin on your back. Tell your health care provider of new moles, moles that have irregular borders, moles that are larger than a pencil eraser, or moles that have changed in shape or color.  Stay current with required vaccines (immunizations).  Influenza vaccine. All adults should be immunized every year.  Tetanus, diphtheria, and acellular pertussis (Td, Tdap) vaccine. Pregnant women should receive 1 dose of  Tdap vaccine during each pregnancy. The dose should be obtained regardless of the length of time since the last dose. Immunization is preferred during the 27th-36th week of gestation. An adult who has not previously received Tdap or who does not know her vaccine status should receive 1 dose of Tdap. This initial dose should be followed by tetanus and diphtheria toxoids (Td) booster doses every 10 years. Adults with an unknown or incomplete history of completing a 3-dose immunization series with Td-containing vaccines should begin or complete a primary immunization series including a Tdap dose. Adults should receive a Td booster every 10 years.    Zoster vaccine. One dose is recommended for adults  aged 31 years or older unless certain conditions are present.    Pneumococcal 13-valent conjugate (PCV13) vaccine. When indicated, a person who is uncertain of her immunization history and has no record of immunization should receive the PCV13 vaccine. An adult aged 45 years or older who has certain medical conditions and has not been previously immunized should receive 1 dose of PCV13 vaccine. This PCV13 should be followed with a dose of pneumococcal polysaccharide (PPSV23) vaccine. The PPSV23 vaccine dose should be obtained at least 8 weeks after the dose of PCV13 vaccine. An adult aged 48 years or older who has certain medical conditions and previously received 1 or more doses of PPSV23 vaccine should receive 1 dose of PCV13. The PCV13 vaccine dose should be obtained 1 or more years after the last PPSV23 vaccine dose.    Pneumococcal polysaccharide (PPSV23) vaccine. When PCV13 is also indicated, PCV13 should be obtained first. All adults aged 81 years and older should be immunized. An adult younger than age 36 years who has certain medical conditions should be immunized. Any person who resides in a nursing home or long-term care facility should be immunized. An adult smoker should be immunized. People with an  immunocompromised condition and certain other conditions should receive both PCV13 and PPSV23 vaccines. People with human immunodeficiency virus (HIV) infection should be immunized as soon as possible after diagnosis. Immunization during chemotherapy or radiation therapy should be avoided. Routine use of PPSV23 vaccine is not recommended for American Indians, [place], or people younger than 65 years unless there are medical conditions that require PPSV23 vaccine. When indicated, people who have unknown immunization and have no record of immunization should receive PPSV23 vaccine. One-time revaccination 5 years after the first dose of PPSV23 is recommended for people aged 19-64 years who have chronic kidney failure, nephrotic syndrome, asplenia, or immunocompromised conditions. People who received 1-2 doses of PPSV23 before age 57 years should receive another dose of PPSV23 vaccine at age 41 years or later if at least 5 years have passed since the previous dose. Doses of PPSV23 are not needed for people immunized with PPSV23 at or after age 53 years.   Preventive Services / Frequency  Ages 62 years and over  Blood pressure check.  Lipid and cholesterol check.  Lung cancer screening. / Every year if you are aged 51-80 years and have a 30-pack-year history of smoking and currently smoke or have quit within the past 15 years. Yearly screening is stopped once you have quit smoking for at least 15 years or develop a health problem that would prevent you from having lung cancer treatment.  Clinical breast exam.** / Every year after age 67 years.  BRCA-related cancer risk assessment.** / For women who have family members with a BRCA-related cancer (breast, ovarian, tubal, or peritoneal cancers).  Mammogram.** / Every year beginning at age 59 years and continuing for as long as you are in good health. Consult with your health care provider.  Pap test.** / Every 3 years starting at age 26 years  through age 42 or 52 years with 3 consecutive normal Pap tests. Testing can be stopped between 65 and 70 years with 3 consecutive normal Pap tests and no abnormal Pap or HPV tests in the past 10 years.  Fecal occult blood test (FOBT) of stool. / Every year beginning at age 19 years and continuing until age 36 years. You may not need to do this test if you get a colonoscopy every 10 years.  Flexible sigmoidoscopy or colonoscopy.** / Every 5 years for a flexible sigmoidoscopy or every 10 years for a colonoscopy beginning at age 43 years and continuing until age 33 years.  Hepatitis C blood test.** / For all people born from [birth year] through 1964 and any individual with known risks for hepatitis C.  Osteoporosis screening.** / A one-time screening for women ages 16 years and over and women at risk for fractures or osteoporosis.  Skin self-exam. / Monthly.  Influenza vaccine. / Every year.  Tetanus, diphtheria, and acellular pertussis (Tdap/Td) vaccine.** / 1 dose of Td every 10 years.  Zoster vaccine.** / 1 dose for adults aged 56 years or older.  Pneumococcal 13-valent conjugate (PCV13) vaccine.** / Consult your health care provider.  Pneumococcal polysaccharide (PPSV23) vaccine.** / 1 dose for all adults aged 17 years and older. Screening for abdominal aortic aneurysm (AAA)  by ultrasound is recommended for people who have history of high blood pressure or who are current or former smokers.

## 2015-01-19 NOTE — Progress Notes (Signed)
MEDICARE ANNUAL WELLNESS VISIT AND FOLLOW UP  Assessment:   1. Hypertension Some hypotension with history of two falls which she states were from tripping, will still cut atenolol in 1/2 and have her monitor BP - CBC with Differential - BASIC METABOLIC PANEL WITH GFR - Hepatic function panel - TSH  2. GERD (gastroesophageal reflux disease) -controlled  3. T2 NIDDM Discussed general issues about diabetes pathophysiology and management., Educational material distributed., Suggested low cholesterol diet., Encouraged aerobic exercise., Discussed foot care., Reminded to get yearly retinal exam. - Hemoglobin A1c - Insulin, fasting - HM DIABETES FOOT EXAM  4. Hyperlipidemia - Lipid panel  5. Vitamin D deficiency - Vit D  25 hydroxy (rtn osteoporosis monitoring)  6. Depression -remission  7. Encounter for long-term (current) use of other medications - Magnesium  8. Osteopenia Due next year  92. Vaccines Will consider shingles vaccine   10. Nocturia/trouble sleeping Try allergy pill/bladder matters given, can try melatonin  11. Constipation Increase fluids, fiber, add benefiber, add magnesium   Plan:   During the course of the visit the patient was educated and counseled about appropriate screening and preventive services including:    Pneumococcal vaccine   Influenza vaccine  Td vaccine  Screening electrocardiogram  Screening mammography  Bone densitometry screening  Colorectal cancer screening  Diabetes screening  Glaucoma screening  Nutrition counseling   Advanced directives: given info/requested  Conditions/risks identified: BMI: Discussed weight loss, diet, and increase physical activity.  Increase physical activity: AHA recommends 150 minutes of physical activity a week.  Medications reviewed DEXA- will get next year Diabetes is at goal, ACE/ARB therapy: Yes. Urinary Incontinence is not an issue: discussed non pharmacology and pharmacology  options.   Fall risk: moderate-high risk- discussed PT, home fall assessment, medications.    Subjective:   Melissa Davis is a 74 y.o. female who presents for Medicare Annual Wellness Visit and 3 month follow up on hypertension, prediabetes, hyperlipidemia, vitamin D def.  Date of last medicare wellness visit was 12/29/2013.  Her blood pressure has been controlled at home, today their BP is BP: 138/70 mmHg She does workout, she keeps her 8 year old grandson some days and walks. She denies chest pain, shortness of breath, dizziness.  She is not on cholesterol medication and denies myalgias. Her cholesterol is at goal. The cholesterol last visit was:   Lab Results  Component Value Date   CHOL 182 10/15/2014   HDL 54 10/15/2014   LDLCALC 102* 10/15/2014   TRIG 128 10/15/2014   CHOLHDL 3.4 10/15/2014   She has been working on diet and exercise for diabetes, she is on metformin, she is on ACE, she is on bASA, and denies paresthesia of the feet, polydipsia and polyuria. Last A1C in the office was:  Lab Results  Component Value Date   HGBA1C 6.4* 10/15/2014   Patient is on Vitamin D supplement, 1000 IU but occ misses some. Lab Results  Component Value Date   VD25OH 26* 10/15/2014   She has glaucoma and folllows very closely with her eye doctor.  She states she has been having sleep issues since Feb, she can get to sleep fine but she will wake up 1-2 times to go to the bathroom and occ will not be able to go back to sleep at that time. She goes to the bathroom often during the day and has occ urge incontinence. She will go to bed at 10 PM, and wake up at 2-3 but normally sleeps until  6-7 AM.  States that when her stomach is empty she will have a gnawing sensation and have to tums. Denies black stools, blood in stool, no NSAIDs/alcohol use.   Medication Review Current Outpatient Prescriptions on File Prior to Visit  Medication Sig Dispense Refill  . amLODipine (NORVASC) 10 MG tablet TAKE 1  TABLET EVERY DAY 90 tablet 99  . atenolol (TENORMIN) 100 MG tablet TAKE 1 TABLET EVERY DAY 90 tablet 2  . Blood Glucose Monitoring Suppl (ONE TOUCH ULTRA SYSTEM KIT) W/DEVICE KIT 1 kit by Does not apply route once. Check glucose 1 time daily-DX-E11.2 1 each 0  . Cholecalciferol (VITAMIN D PO) Take 1,000 Int'l Units by mouth daily.    . clorazepate (TRANXENE) 7.5 MG tablet Take 1/2 to 1 tablet 2 to 3 x day if needed for anxiety 90 tablet 3  . glucose blood (ONE TOUCH ULTRA TEST) test strip Check glucose 1 time daily-DX-E11.2 100 each PRN  . Lancets (ONETOUCH ULTRASOFT) lancets Check glucose 1 time daily-DX-E11.2 100 each PRN  . metFORMIN (GLUCOPHAGE-XR) 500 MG 24 hr tablet Take 4 tablets daily or as directed for Diabetes 360 tablet 99  . quinapril (ACCUPRIL) 40 MG tablet TAKE 1 TABLET EVERY DAY 90 tablet 1  . TRAVATAN Z 0.004 % SOLN ophthalmic solution Place 1 drop into both eyes at bedtime.      No current facility-administered medications on file prior to visit.    Current Problems (verified) Patient Active Problem List   Diagnosis Date Noted  . Constipation 01/19/2015  . Osteopenia 07/03/2014  . Medication management 09/26/2013  . Glaucoma 09/26/2013  . T2_NIDDM   . Hyperlipidemia   . Hypertension   . Vitamin D deficiency   . GERD (gastroesophageal reflux disease)   . Depression, controlled     Screening Tests Health Maintenance  Topic Date Due  . ZOSTAVAX  12/07/2001  . INFLUENZA VACCINE  02/15/2015  . HEMOGLOBIN A1C  04/16/2015  . FOOT EXAM  07/04/2015  . URINE MICROALBUMIN  07/04/2015  . PNA vac Low Risk Adult (2 of 2 - PPSV23) 07/04/2015  . MAMMOGRAM  01/03/2016  . OPHTHALMOLOGY EXAM  01/04/2016  . COLONOSCOPY  07/24/2019  . TETANUS/TDAP  06/17/2022  . DEXA SCAN  Completed     Immunization History  Administered Date(s) Administered  . Influenza Split 05/01/2013  . Influenza, High Dose Seasonal PF 03/31/2014  . Pneumococcal Conjugate-13 07/03/2014  .  Pneumococcal Polysaccharide-23 08/05/1998  . Td 06/17/2012    Preventative care: Tetanus: 2013  Pneumovax: 2000  Prevnar 13: 2015 Flu vaccine: 2015 Zostavax:  Cost 95 dollars, she will check with [facility] on price first  Pap: 2010 neg declines another MGM: 12/2013 CATEGORY D DEXA: 12/2013 Osteopenia due 2017 Colonoscopy: 2011 due 10 years  EGD: N/A DEE: 04/2013 CXR 10/2013 Cath: 2010 normal EF 60% CT cervical spine 07/2013 CT head 07/2013  Names of Other Physician/Practitioners you currently use: 1. [facility]- here for primary care 2. Dr. Bennye Alm, dentist, last visit q 6 mons Patient Care Team: Unk Pinto, MD as PCP - General (Internal Medicine) Rob Hickman, MD as Consulting Physician (Ophthalmology) June 2016 Lafayette Dragon, MD as Consulting Physician (Gastroenterology) Kendell Bane, MD as Consulting Physician (Podiatry)  Allergies Allergies  Allergen Reactions  . Buspirone   . Clonidine Derivatives   . Cymbalta [Duloxetine Hcl]   . Elavil [Amitriptyline]   . Hytrin [Terazosin]   . Nizofenone   . Paxil [Paroxetine Hcl]   .  Prozac [Fluoxetine Hcl]   . Zoloft [Sertraline Hcl]    Surgical history Past Surgical History  Procedure Laterality Date  . Cholecystectomy    . Total abdominal hysterectomy w/ bilateral salpingoophorectomy  1990   Family history Family History  Problem Relation Age of Onset  . Hypertension Mother   . Heart attack Mother   . Ulcers Mother   . CVA Father   . Alzheimer's disease Father   . Hypertension Father    Tobacco History  Substance Use Topics  . Smoking status: Never Smoker   . Smokeless tobacco: Not on file  . Alcohol Use: No   MEDICARE WELLNESS OBJECTIVES: Tobacco use: She does not smoke.  Patient is not a former smoker. Alcohol Current alcohol use: none Caffeine Current caffeine use: coffee 1-2 /day Osteoporosis: postmenopausal estrogen deficiency and dietary calcium  and/or vitamin D deficiency, History of fracture in the past year: no, had fractures with fall the year before Diet: in general, a "healthy" diet   Physical activity: walking and watches grandkids Depression/mood screen:   Depression screen [facility] 2/9 01/19/2015  Decreased Interest 0  Down, Depressed, Hopeless 0  PHQ - 2 Score 0   Hearing: normal Visual acuity: normal,  does perform annual eye exam  ADLs:  In your present state of health, do you have any difficulty performing the following activities: 01/19/2015  Hearing? N  Vision? N  Difficulty concentrating or making decisions? N  Walking or climbing stairs? N  Dressing or bathing? N  Doing errands, shopping? N  Preparing Food and eating ? N  Using the Toilet? N  In the past six months, have you accidently leaked urine? Y  Do you have problems with loss of bowel control? N  Managing your Medications? N  Managing your Finances? N  Housekeeping or managing your Housekeeping? N    Fall risk: Moderate Risk- high risk Cognitive Testing  Alert? Yes  Normal Appearance?Yes  Oriented to person? Yes  Place? Yes   Time? Yes  Recall of three objects?  Yes  Can perform simple calculations? Yes  Displays appropriate judgment?Yes  Can read the correct time from a watch face?Yes EOL planning: Does patient have an advance directive?: Yes Type of Advance Directive: [place], Living will Does patient want to make changes to advanced directive?: No - Patient declined Copy of advanced directive(s) in chart?: No - copy requested     Objective:   Blood pressure 138/70, pulse 56, temperature 97.7 F (36.5 C), resp. rate 16, height 5' 3" (1.6 m), weight 114 lb (51.71 kg). Body mass index is 20.2 kg/(m^2).  General appearance: alert, no distress, WD/WN,  female HEENT: normocephalic, sclerae anicteric, TMs pearly, nares patent, no discharge or erythema, pharynx normal Oral cavity: MMM, no lesions Neck: supple, no  lymphadenopathy, no thyromegaly, no masses Heart: RRR, normal S1, S2, no murmurs Lungs: CTA bilaterally, no wheezes, rhonchi, or rales Abdomen: +bs, soft, non tender, non distended, no masses, no hepatomegaly, no splenomegaly Musculoskeletal: nontender, no swelling, no obvious deformity, mild cervical kyphosis Extremities: no edema, no cyanosis, no clubbing Pulses: 2+ symmetric, upper and lower extremities, normal cap refill Neurological: alert, oriented x 3, CN2-12 intact, strength normal upper extremities and lower extremities, sensation normal throughout, DTRs 2+ throughout, no cerebellar signs, gait antalgic with cane Psychiatric: normal affect, behavior normal, pleasant  Breast: defer Gyn: defer Rectal: defer  Medicare Attestation I have personally reviewed: The patient's medical and social history Their use of alcohol, tobacco or  illicit drugs Their current medications and supplements The patient's functional ability including ADLs,fall risks, home safety risks, cognitive, and hearing and visual impairment Diet and physical activities Evidence for depression or mood disorders  The patient's weight, height, BMI, and visual acuity have been recorded in the chart.  I have made referrals, counseling, and provided education to the patient based on review of the above and I have provided the patient with a written personalized care plan for preventive services.     Vicie Mutters, PA-C   01/19/2015

## 2015-01-20 ENCOUNTER — Other Ambulatory Visit: Payer: Self-pay

## 2015-01-20 DIAGNOSIS — Z1231 Encounter for screening mammogram for malignant neoplasm of breast: Secondary | ICD-10-CM

## 2015-01-20 LAB — LIPID PANEL
Cholesterol: 188 mg/dL (ref 0–200)
HDL: 65 mg/dL (ref >=46)
LDL Cholesterol: 103 mg/dL — ABNORMAL HIGH (ref 0–99)
TRIGLYCERIDES: 98 mg/dL (ref <150)
Total CHOL/HDL Ratio: 2.9 Ratio
VLDL: 20 mg/dL (ref 0–40)

## 2015-01-20 LAB — BASIC METABOLIC PANEL WITH GFR
BUN: 17 mg/dL (ref 6–23)
CO2: 28 meq/L (ref 19–32)
Calcium: 9.4 mg/dL (ref 8.4–10.5)
Chloride: 101 mEq/L (ref 96–112)
Creat: 0.63 mg/dL (ref 0.50–1.10)
GFR, Est African American: >89 mL/min
GFR, Est Non African American: 89 mL/min
GLUCOSE: 90 mg/dL (ref 70–99)
POTASSIUM: 4.4 meq/L (ref 3.5–5.3)
SODIUM: 140 meq/L (ref 135–145)

## 2015-01-20 LAB — MAGNESIUM: Magnesium: 2 mg/dL (ref 1.5–2.5)

## 2015-01-20 LAB — HEPATIC FUNCTION PANEL
ALT: 11 U/L (ref 0–35)
AST: 18 U/L (ref 0–37)
Albumin: 4.3 g/dL (ref 3.5–5.2)
Alkaline Phosphatase: 66 U/L (ref 39–117)
BILIRUBIN TOTAL: 0.7 mg/dL (ref 0.2–1.2)
Bilirubin, Direct: 0.1 mg/dL (ref 0.0–0.3)
Indirect Bilirubin: 0.6 mg/dL (ref 0.2–1.2)
Total Protein: 7.1 g/dL (ref 6.0–8.3)

## 2015-01-20 LAB — TSH: TSH: 0.831 u[IU]/mL (ref 0.350–4.500)

## 2015-01-20 LAB — INSULIN, FASTING: INSULIN FASTING, SERUM: 3.4 u[IU]/mL (ref 2.0–19.6)

## 2015-01-20 LAB — VITAMIN D 25 HYDROXY (VIT D DEFICIENCY, FRACTURES): VIT D 25 HYDROXY: 21 ng/mL — AB (ref 30–100)

## 2015-02-24 ENCOUNTER — Encounter: Payer: Self-pay | Admitting: Podiatry

## 2015-02-24 ENCOUNTER — Ambulatory Visit (INDEPENDENT_AMBULATORY_CARE_PROVIDER_SITE_OTHER): Payer: Medicare Other | Admitting: Podiatry

## 2015-02-24 DIAGNOSIS — M79676 Pain in unspecified toe(s): Secondary | ICD-10-CM | POA: Diagnosis not present

## 2015-02-24 DIAGNOSIS — B351 Tinea unguium: Secondary | ICD-10-CM | POA: Diagnosis not present

## 2015-02-24 DIAGNOSIS — E119 Type 2 diabetes mellitus without complications: Secondary | ICD-10-CM | POA: Diagnosis not present

## 2015-02-24 NOTE — Progress Notes (Signed)
Patient ID: Melissa Davis, female   DOB: Jun 29, 1941, 74 y.o.   MRN: [id number]  Subjective: This patient presents today for scheduled visit complaining of painful toenails walking wearing shoes and request toenail debridement  Objective: The toenails are brittle, elongated, discolored, incurvated, hypertrophic and tender to direct palpation 6-10  Assessment: Symptomatic onychomycoses 6-10 Type II diabetic without complications  Plan: Debridement of toenails mechanically and electrically 10 without any bleeding  Reappoint 3 months

## 2015-02-24 NOTE — Patient Instructions (Signed)
Diabetes and Foot Care Diabetes may cause you to have problems because of poor blood supply (circulation) to your feet and legs. This may cause the skin on your feet to become thinner, break easier, and heal more slowly. Your skin may become dry, and the skin may peel and crack. You may also have nerve damage in your legs and feet causing decreased feeling in them. You may not notice minor injuries to your feet that could lead to infections or more serious problems. Taking care of your feet is one of the most important things you can do for yourself.  HOME CARE INSTRUCTIONS  Wear shoes at all times, even in the house. Do not go barefoot. Bare feet are easily injured.  Check your feet daily for blisters, cuts, and redness. If you cannot see the bottom of your feet, use a mirror or ask someone for help.  Wash your feet with warm water (do not use hot water) and mild soap. Then pat your feet and the areas between your toes until they are completely dry. Do not soak your feet as this can dry your skin.  Apply a moisturizing lotion or petroleum jelly (that does not contain alcohol and is unscented) to the skin on your feet and to dry, brittle toenails. Do not apply lotion between your toes.  Trim your toenails straight across. Do not dig under them or around the cuticle. File the edges of your nails with an emery board or nail file.  Do not cut corns or calluses or try to remove them with medicine.  Wear clean socks or stockings every day. Make sure they are not too tight. Do not wear knee-high stockings since they may decrease blood flow to your legs.  Wear shoes that fit properly and have enough cushioning. To break in new shoes, wear them for just a few hours a day. This prevents you from injuring your feet. Always look in your shoes before you put them on to be sure there are no objects inside.  Do not cross your legs. This may decrease the blood flow to your feet.  If you find a minor scrape,  cut, or break in the skin on your feet, keep it and the skin around it clean and dry. These areas may be cleansed with mild soap and water. Do not cleanse the area with peroxide, alcohol, or iodine.  When you remove an adhesive bandage, be sure not to damage the skin around it.  If you have a wound, look at it several times a day to make sure it is healing.  Do not use heating pads or hot water bottles. They may burn your skin. If you have lost feeling in your feet or legs, you may not know it is happening until it is too late.  Make sure your health care provider performs a complete foot exam at least annually or more often if you have foot problems. Report any cuts, sores, or bruises to your health care provider immediately. SEEK MEDICAL CARE IF:   You have an injury that is not healing.  You have cuts or breaks in the skin.  You have an ingrown nail.  You notice redness on your legs or feet.  You feel burning or tingling in your legs or feet.  You have pain or cramps in your legs and feet.  Your legs or feet are numb.  Your feet always feel cold. SEEK IMMEDIATE MEDICAL CARE IF:   There is increasing redness,   swelling, or pain in or around a wound.  There is a red line that goes up your leg.  Pus is coming from a wound.  You develop a fever or as directed by your health care provider.  You notice a bad smell coming from an ulcer or wound. Document Released: 06/30/2000 Document Revised: 03/05/2013 Document Reviewed: 12/10/2012 [employer] Patient Information 2015 [employer]. This information is not intended to replace advice given to you by your health care provider. Make sure you discuss any questions you have with your health care provider.  

## 2015-03-04 ENCOUNTER — Ambulatory Visit
Admission: RE | Admit: 2015-03-04 | Discharge: 2015-03-04 | Disposition: A | Discharge status: Home / Self Care | Payer: Medicare Other | Source: Ambulatory Visit

## 2015-03-04 DIAGNOSIS — Z1231 Encounter for screening mammogram for malignant neoplasm of breast: Secondary | ICD-10-CM

## 2015-03-05 ENCOUNTER — Other Ambulatory Visit: Payer: Self-pay | Admitting: Physician Assistant

## 2015-03-05 DIAGNOSIS — R928 Other abnormal and inconclusive findings on diagnostic imaging of breast: Secondary | ICD-10-CM

## 2015-03-11 ENCOUNTER — Ambulatory Visit
Admission: RE | Admit: 2015-03-11 | Discharge: 2015-03-11 | Disposition: A | Discharge status: Home / Self Care | Payer: Medicare Other | Source: Ambulatory Visit | Attending: Physician Assistant | Admitting: Physician Assistant

## 2015-03-11 DIAGNOSIS — R928 Other abnormal and inconclusive findings on diagnostic imaging of breast: Secondary | ICD-10-CM

## 2015-04-22 ENCOUNTER — Ambulatory Visit (INDEPENDENT_AMBULATORY_CARE_PROVIDER_SITE_OTHER): Payer: Medicare Other | Admitting: Internal Medicine

## 2015-04-22 ENCOUNTER — Encounter: Payer: Self-pay | Admitting: Internal Medicine

## 2015-04-22 VITALS — BP 124/60 | HR 60 | Temp 97.7°F | Resp 16 | Ht 63.0 in | Wt 112.8 lb

## 2015-04-22 DIAGNOSIS — E559 Vitamin D deficiency, unspecified: Secondary | ICD-10-CM

## 2015-04-22 DIAGNOSIS — I10 Essential (primary) hypertension: Secondary | ICD-10-CM

## 2015-04-22 DIAGNOSIS — E785 Hyperlipidemia, unspecified: Secondary | ICD-10-CM

## 2015-04-22 DIAGNOSIS — Z23 Encounter for immunization: Secondary | ICD-10-CM

## 2015-04-22 DIAGNOSIS — Z6820 Body mass index (BMI) 20.0-20.9, adult: Secondary | ICD-10-CM | POA: Insufficient documentation

## 2015-04-22 DIAGNOSIS — K219 Gastro-esophageal reflux disease without esophagitis: Secondary | ICD-10-CM | POA: Diagnosis not present

## 2015-04-22 DIAGNOSIS — E119 Type 2 diabetes mellitus without complications: Secondary | ICD-10-CM | POA: Diagnosis not present

## 2015-04-22 DIAGNOSIS — Z79899 Other long term (current) drug therapy: Secondary | ICD-10-CM

## 2015-04-22 LAB — HEPATIC FUNCTION PANEL
ALBUMIN: 4.2 g/dL (ref 3.6–5.1)
ALK PHOS: 71 U/L (ref 33–130)
ALT: 12 U/L (ref 6–29)
AST: 18 U/L (ref 10–35)
BILIRUBIN TOTAL: 0.8 mg/dL (ref 0.2–1.2)
Bilirubin, Direct: 0.1 mg/dL (ref <=0.2)
Indirect Bilirubin: 0.7 mg/dL (ref 0.2–1.2)
TOTAL PROTEIN: 7.1 g/dL (ref 6.1–8.1)

## 2015-04-22 LAB — BASIC METABOLIC PANEL WITH GFR
BUN: 17 mg/dL (ref 7–25)
CALCIUM: 9.3 mg/dL (ref 8.6–10.4)
CO2: 31 mmol/L (ref 20–31)
CREATININE: 0.61 mg/dL (ref 0.60–0.93)
Chloride: 104 mmol/L (ref 98–110)
GFR, Est Non African American: >89 mL/min (ref >=60)
GLUCOSE: 82 mg/dL (ref 65–99)
Potassium: 4.3 mmol/L (ref 3.5–5.3)
Sodium: 142 mmol/L (ref 135–146)

## 2015-04-22 LAB — CBC WITH DIFFERENTIAL/PLATELET
BASOS ABS: 0.1 10*3/uL (ref 0.0–0.1)
Basophils Relative: 1 % (ref 0–1)
EOS ABS: 0.1 10*3/uL (ref 0.0–0.7)
EOS PCT: 1 % (ref 0–5)
HEMATOCRIT: 40.1 % (ref 36.0–46.0)
Hemoglobin: 13.5 g/dL (ref 12.0–15.0)
LYMPHS ABS: 1.6 10*3/uL (ref 0.7–4.0)
LYMPHS PCT: 25 % (ref 12–46)
MCH: 30 pg (ref 26.0–34.0)
MCHC: 33.7 g/dL (ref 30.0–36.0)
MCV: 89.1 fL (ref 78.0–100.0)
MONO ABS: 0.5 10*3/uL (ref 0.1–1.0)
MPV: 10 fL (ref 8.6–12.4)
Monocytes Relative: 8 % (ref 3–12)
Neutro Abs: 4.2 10*3/uL (ref 1.7–7.7)
Neutrophils Relative %: 65 % (ref 43–77)
PLATELETS: 266 10*3/uL (ref 150–400)
RBC: 4.5 MIL/uL (ref 3.87–5.11)
RDW: 13.2 % (ref 11.5–15.5)
WBC: 6.4 10*3/uL (ref 4.0–10.5)

## 2015-04-22 LAB — LIPID PANEL
CHOLESTEROL: 196 mg/dL (ref 125–200)
HDL: 58 mg/dL (ref >=46)
LDL CALC: 111 mg/dL (ref <130)
TRIGLYCERIDES: 137 mg/dL (ref <150)
Total CHOL/HDL Ratio: 3.4 Ratio (ref <=5.0)
VLDL: 27 mg/dL (ref <30)

## 2015-04-22 LAB — TSH: TSH: 0.763 u[IU]/mL (ref 0.350–4.500)

## 2015-04-22 LAB — HEMOGLOBIN A1C
Hgb A1c MFr Bld: 6.1 % — ABNORMAL HIGH (ref <5.7)
MEAN PLASMA GLUCOSE: 128 mg/dL — AB (ref <117)

## 2015-04-22 LAB — MAGNESIUM: MAGNESIUM: 1.9 mg/dL (ref 1.5–2.5)

## 2015-04-22 NOTE — Patient Instructions (Signed)

## 2015-04-22 NOTE — Progress Notes (Signed)
Patient ID: Melissa Davis, female   DOB: [birth year], 74 y.o.   MRN: [id number]   This very delightful  74 y.o. [facility] presents for follow up with Hypertension, Hyperlipidemia, Pre-Diabetes and Vitamin D Deficiency.    Patient is treated for HTN since 1994 & BP has been controlled at home. Today's BP: 124/60 mmHg. Patient has had no complaints of any cardiac type chest pain, palpitations, dyspnea/orthopnea/PND, dizziness, claudication, or dependent edema.   Hyperlipidemia is controlled with diet & meds. Patient denies myalgias or other med SE's. Last Lipids were near goal - Cholesterol 188; HDL 65; LDL 103*; Triglycerides 98 on 01/19/2015.   Also, the patient has history of T2_NIDDM since 2004 and has had no symptoms of reactive hypoglycemia, diabetic polys, paresthesias or visual blurring.  She reports FBG's range in the 80-100 mg% range. Last A1c was  6.0% on 01/19/2015.   Further, the patient also has history of Vitamin D Deficiency and & does not supplement vitamin D. Last vitamin D was  21 on 01/19/2015.  Medication Sig  . amLODipine 10 MG tablet TAKE 1 TABLET EVERY DAY  . atenolol  100 MG tablet TAKE 1 TABLET EVERY DAY  . VITAMIN D  Take 1,000 Int'l Units by mouth daily.  . metFORMIN -XR 500 MG 24 hr tablet Take 4 tablets daily or as directed for Diabetes  . quinapril  40 MG tablet TAKE 1 TABLET EVERY DAY  . ranitidine (ZANTAC) 300 MG tablet Take 1 tab at bedtime.  . TRAVATAN Z 0.004 % SOLN ophthalmic solution Place 1 drop into both eyes at bedtime.    Allergies  Allergen Reactions  . Buspirone   . Clonidine Derivatives   . Cymbalta [Duloxetine Hcl]   . Elavil [Amitriptyline]   . Hytrin [Terazosin]   . Nizofenone   . Paxil [Paroxetine Hcl]   . Prozac [Fluoxetine Hcl]   . Zoloft [Sertraline Hcl]    PMHx:   Past Medical History  Diagnosis Date  . Hyperlipidemia   . Hypertension   . Vitamin D deficiency   . GERD (gastroesophageal reflux disease)   . Type II or unspecified type diabetes  mellitus without mention of complication, not stated as uncontrolled   . Depression    Immunization History  Administered Date(s) Administered  . Influenza Split 05/01/2013  . Influenza, High Dose Seasonal PF 03/31/2014  . Pneumococcal Conjugate-13 07/03/2014  . Pneumococcal Polysaccharide-23 08/05/1998  . Td 06/17/2012   Past Surgical History  Procedure Laterality Date  . Cholecystectomy    . Total abdominal hysterectomy w/ bilateral salpingoophorectomy  1990   FHx:    Reviewed / unchanged  SHx:    Reviewed / unchanged  Systems Review:  Constitutional: Denies fever, chills, wt changes, headaches, insomnia, fatigue, night sweats, change in appetite. Eyes: Denies redness, blurred vision, diplopia, discharge, itchy, watery eyes.  ENT: Denies discharge, congestion, post nasal drip, epistaxis, sore throat, earache, hearing loss, dental pain, tinnitus, vertigo, sinus pain, snoring.  CV: Denies chest pain, palpitations, irregular heartbeat, syncope, dyspnea, diaphoresis, orthopnea, PND, claudication or edema. Respiratory: denies cough, dyspnea, DOE, pleurisy, hoarseness, laryngitis, wheezing.  Gastrointestinal: Denies dysphagia, odynophagia, heartburn, reflux, water brash, abdominal pain or cramps, nausea, vomiting, bloating, diarrhea, constipation, hematemesis, melena, hematochezia  or hemorrhoids. Genitourinary: Denies dysuria, frequency, urgency, nocturia, hesitancy, discharge, hematuria or flank pain. Musculoskeletal: Denies arthralgias, myalgias, stiffness, jt. swelling, pain, limping or strain/sprain.  Skin: Denies pruritus, rash, hives, warts, acne, eczema or change in skin lesion(s). Neuro: No weakness, tremor, incoordination, spasms,  paresthesia or pain. Psychiatric: Denies confusion, memory loss or sensory loss. Endo: Denies change in weight, skin or hair change.  Heme/Lymph: No excessive bleeding, bruising or enlarged lymph nodes.  Physical Exam  BP 124/60 mmHg  Pulse 60   Temp(Src) 97.7 F (36.5 C)  Resp 16  Ht  (1.6 m)  Wt 112 lb 12.8 oz (51.166 kg)  BMI 19.99 kg/m2  Appears well nourished and in no distress. Eyes: PERRLA, EOMs, conjunctiva no swelling or erythema. Sinuses: No frontal/maxillary tenderness ENT/Mouth: EAC's clear, TM's nl w/o erythema, bulging. Nares clear w/o erythema, swelling, exudates. Oropharynx clear without erythema or exudates. Oral hygiene is good. Tongue normal, non obstructing. Hearing intact.  Neck: Supple. Thyroid nl. Car 2+/2+ without bruits, nodes or JVD. Chest: Respirations nl with BS clear & equal w/o rales, rhonchi, wheezing or stridor.  Cor: Heart sounds normal w/ regular rate and rhythm without sig. murmurs, gallops, clicks, or rubs. Peripheral pulses normal and equal  without edema.  Abdomen: Soft & bowel sounds normal. Non-tender w/o guarding, rebound, hernias, masses, or organomegaly.  Lymphatics: Unremarkable.  Musculoskeletal: Full ROM all peripheral extremities, joint stability, 5/5 strength, and normal gait.  Skin: Warm, dry without exposed rashes, lesions or ecchymosis apparent.  Neuro: Cranial nerves intact, reflexes equal bilaterally. Sensory-motor testing grossly intact. Tendon reflexes grossly intact.  Pysch: Alert & oriented x 3.  Insight and judgement nl & appropriate. No ideations.  Assessment and Plan:  1. Essential hypertension  - TSH  2. Hyperlipidemia  - Lipid panel  3. Vitamin D deficiency  - Hemoglobin A1c - Insulin, random  4. Gastroesophageal reflux disease  - Vit D  25 hydroxy   5. Body mass index (BMI) of 20.0-20.9 in adult   6. Medication management  - CBC with Differential/Platelet - BASIC METABOLIC PANEL WITH GFR - Hepatic function panel - Magnesium   Recommended regular exercise, BP monitoring, weight control, and discussed med and SE's. Recommended labs to assess and monitor clinical status. Further disposition pending results of labs. Over 30 minutes of exam,  counseling, chart review was performed

## 2015-04-23 LAB — INSULIN, RANDOM: Insulin: 3.9 u[IU]/mL (ref 2.0–19.6)

## 2015-04-23 LAB — VITAMIN D 25 HYDROXY (VIT D DEFICIENCY, FRACTURES): Vit D, 25-Hydroxy: 21 ng/mL — ABNORMAL LOW (ref 30–100)

## 2015-05-11 ENCOUNTER — Other Ambulatory Visit: Payer: Self-pay | Admitting: Internal Medicine

## 2015-06-02 ENCOUNTER — Ambulatory Visit: Payer: Medicare Other | Admitting: Podiatry

## 2015-06-22 ENCOUNTER — Encounter: Payer: Self-pay | Admitting: Podiatry

## 2015-06-22 ENCOUNTER — Ambulatory Visit (INDEPENDENT_AMBULATORY_CARE_PROVIDER_SITE_OTHER): Payer: Medicare Other | Admitting: Podiatry

## 2015-06-22 DIAGNOSIS — M79676 Pain in unspecified toe(s): Secondary | ICD-10-CM | POA: Diagnosis not present

## 2015-06-22 DIAGNOSIS — B351 Tinea unguium: Secondary | ICD-10-CM

## 2015-06-22 NOTE — Progress Notes (Signed)
Patient ID: Melissa Davis, female   DOB: 1941/03/02, 74 y.o.   MRN: [id number]  Subjective: This patient presents for scheduled visit complaining of painful toenails walking wearing shoes and requests nail debridement  Objective: The toenails elongated, brittle, hypertrophic, deformed and tender direct palpation 6-10  Assessment: Type II diabetic without complications Symptomatic onychomycoses 6-10  Plan: Debridement toenails 10 and mechanically electronically without any bleeding  Reappoint 3 months

## 2015-06-22 NOTE — Patient Instructions (Signed)
Diabetes and Foot Care Diabetes may cause you to have problems because of poor blood supply (circulation) to your feet and legs. This may cause the skin on your feet to become thinner, break easier, and heal more slowly. Your skin may become dry, and the skin may peel and crack. You may also have nerve damage in your legs and feet causing decreased feeling in them. You may not notice minor injuries to your feet that could lead to infections or more serious problems. Taking care of your feet is one of the most important things you can do for yourself.  HOME CARE INSTRUCTIONS  Wear shoes at all times, even in the house. Do not go barefoot. Bare feet are easily injured.  Check your feet daily for blisters, cuts, and redness. If you cannot see the bottom of your feet, use a mirror or ask someone for help.  Wash your feet with warm water (do not use hot water) and mild soap. Then pat your feet and the areas between your toes until they are completely dry. Do not soak your feet as this can dry your skin.  Apply a moisturizing lotion or petroleum jelly (that does not contain alcohol and is unscented) to the skin on your feet and to dry, brittle toenails. Do not apply lotion between your toes.  Trim your toenails straight across. Do not dig under them or around the cuticle. File the edges of your nails with an emery board or nail file.  Do not cut corns or calluses or try to remove them with medicine.  Wear clean socks or stockings every day. Make sure they are not too tight. Do not wear knee-high stockings since they may decrease blood flow to your legs.  Wear shoes that fit properly and have enough cushioning. To break in new shoes, wear them for just a few hours a day. This prevents you from injuring your feet. Always look in your shoes before you put them on to be sure there are no objects inside.  Do not cross your legs. This may decrease the blood flow to your feet.  If you find a minor scrape,  cut, or break in the skin on your feet, keep it and the skin around it clean and dry. These areas may be cleansed with mild soap and water. Do not cleanse the area with peroxide, alcohol, or iodine.  When you remove an adhesive bandage, be sure not to damage the skin around it.  If you have a wound, look at it several times a day to make sure it is healing.  Do not use heating pads or hot water bottles. They may burn your skin. If you have lost feeling in your feet or legs, you may not know it is happening until it is too late.  Make sure your health care provider performs a complete foot exam at least annually or more often if you have foot problems. Report any cuts, sores, or bruises to your health care provider immediately. SEEK MEDICAL CARE IF:   You have an injury that is not healing.  You have cuts or breaks in the skin.  You have an ingrown nail.  You notice redness on your legs or feet.  You feel burning or tingling in your legs or feet.  You have pain or cramps in your legs and feet.  Your legs or feet are numb.  Your feet always feel cold. SEEK IMMEDIATE MEDICAL CARE IF:   There is increasing redness,   swelling, or pain in or around a wound.  There is a red line that goes up your leg.  Pus is coming from a wound.  You develop a fever or as directed by your health care provider.  You notice a bad smell coming from an ulcer or wound.   This information is not intended to replace advice given to you by your health care provider. Make sure you discuss any questions you have with your health care provider.   Document Released: 06/30/2000 Document Revised: 03/05/2013 Document Reviewed: 12/10/2012 [employer] Interactive Patient Education 2016 [employer]  

## 2015-07-05 ENCOUNTER — Encounter: Payer: Self-pay | Admitting: Internal Medicine

## 2015-07-29 ENCOUNTER — Encounter: Payer: Self-pay | Admitting: Internal Medicine

## 2015-07-29 ENCOUNTER — Ambulatory Visit (INDEPENDENT_AMBULATORY_CARE_PROVIDER_SITE_OTHER): Payer: Medicare Other | Admitting: Internal Medicine

## 2015-07-29 VITALS — BP 144/62 | HR 58 | Temp 98.2°F | Resp 16 | Ht 63.5 in | Wt 115.0 lb

## 2015-07-29 DIAGNOSIS — E785 Hyperlipidemia, unspecified: Secondary | ICD-10-CM

## 2015-07-29 DIAGNOSIS — Z79899 Other long term (current) drug therapy: Secondary | ICD-10-CM

## 2015-07-29 DIAGNOSIS — E119 Type 2 diabetes mellitus without complications: Secondary | ICD-10-CM

## 2015-07-29 DIAGNOSIS — I10 Essential (primary) hypertension: Secondary | ICD-10-CM | POA: Diagnosis not present

## 2015-07-29 DIAGNOSIS — Z0000 Encounter for general adult medical examination without abnormal findings: Secondary | ICD-10-CM

## 2015-07-29 DIAGNOSIS — E559 Vitamin D deficiency, unspecified: Secondary | ICD-10-CM

## 2015-07-29 DIAGNOSIS — Z0001 Encounter for general adult medical examination with abnormal findings: Secondary | ICD-10-CM

## 2015-07-29 DIAGNOSIS — Z6820 Body mass index (BMI) 20.0-20.9, adult: Secondary | ICD-10-CM

## 2015-07-29 DIAGNOSIS — D509 Iron deficiency anemia, unspecified: Secondary | ICD-10-CM

## 2015-07-29 LAB — HEPATIC FUNCTION PANEL
ALK PHOS: 85 U/L (ref 33–130)
ALT: 12 U/L (ref 6–29)
AST: 16 U/L (ref 10–35)
Albumin: 4.2 g/dL (ref 3.6–5.1)
BILIRUBIN INDIRECT: 0.6 mg/dL (ref 0.2–1.2)
Bilirubin, Direct: 0.1 mg/dL (ref <=0.2)
TOTAL PROTEIN: 7 g/dL (ref 6.1–8.1)
Total Bilirubin: 0.7 mg/dL (ref 0.2–1.2)

## 2015-07-29 LAB — BASIC METABOLIC PANEL WITH GFR
BUN: 17 mg/dL (ref 7–25)
CHLORIDE: 102 mmol/L (ref 98–110)
CO2: 29 mmol/L (ref 20–31)
Calcium: 9.2 mg/dL (ref 8.6–10.4)
Creat: 0.61 mg/dL (ref 0.60–0.93)
GFR, Est African American: >89 mL/min (ref >=60)
GFR, Est Non African American: >89 mL/min (ref >=60)
GLUCOSE: 93 mg/dL (ref 65–99)
Potassium: 3.8 mmol/L (ref 3.5–5.3)
Sodium: 140 mmol/L (ref 135–146)

## 2015-07-29 LAB — LIPID PANEL
Cholesterol: 184 mg/dL (ref 125–200)
HDL: 62 mg/dL (ref >=46)
LDL CALC: 100 mg/dL (ref <130)
Total CHOL/HDL Ratio: 3 Ratio (ref <=5.0)
Triglycerides: 112 mg/dL (ref <150)
VLDL: 22 mg/dL (ref <30)

## 2015-07-29 LAB — CBC WITH DIFFERENTIAL/PLATELET
Basophils Absolute: 0 10*3/uL (ref 0.0–0.1)
Basophils Relative: 0 % (ref 0–1)
EOS ABS: 0.1 10*3/uL (ref 0.0–0.7)
EOS PCT: 1 % (ref 0–5)
HCT: 40.2 % (ref 36.0–46.0)
Hemoglobin: 13.2 g/dL (ref 12.0–15.0)
LYMPHS ABS: 1.5 10*3/uL (ref 0.7–4.0)
Lymphocytes Relative: 16 % (ref 12–46)
MCH: 30.1 pg (ref 26.0–34.0)
MCHC: 32.8 g/dL (ref 30.0–36.0)
MCV: 91.6 fL (ref 78.0–100.0)
MONO ABS: 0.8 10*3/uL (ref 0.1–1.0)
MONOS PCT: 9 % (ref 3–12)
MPV: 9.9 fL (ref 8.6–12.4)
Neutro Abs: 6.7 10*3/uL (ref 1.7–7.7)
Neutrophils Relative %: 74 % (ref 43–77)
PLATELETS: 282 10*3/uL (ref 150–400)
RBC: 4.39 MIL/uL (ref 3.87–5.11)
RDW: 13.2 % (ref 11.5–15.5)
WBC: 9.1 10*3/uL (ref 4.0–10.5)

## 2015-07-29 LAB — IRON AND TIBC
%SAT: 10 % — AB (ref 11–50)
Iron: 34 ug/dL — ABNORMAL LOW (ref 45–160)
TIBC: 334 ug/dL (ref 250–450)
UIBC: 300 ug/dL (ref 125–400)

## 2015-07-29 LAB — MAGNESIUM: Magnesium: 1.9 mg/dL (ref 1.5–2.5)

## 2015-07-29 NOTE — Progress Notes (Signed)
Patient ID: Melissa Davis, female   DOB: [birth year], 75 y.o.   MRN: [id number]  Complete Physical  Assessment and Plan:   1. 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 Hyperlipidemia  - Lipid panel  4. Vitamin D deficiency  - VITAMIN D 25 Hydroxy (Vit-D Deficiency, Fractures)  5. Medication management  - CBC with Differential/Platelet - BASIC METABOLIC PANEL WITH GFR - Hepatic function panel - Magnesium  6. Body mass index (BMI) of 20.0-20.9 in adult   7. Encounter for general adult medical examination with abnormal findings   8. Anemia, iron deficiency  - Iron and TIBC - Vitamin B12    Discussed med's effects and SE's. Screening labs and tests as requested with regular follow-up as recommended.  HPI  75 y.o. female  presents for a complete physical.  Her blood pressure has been controlled at home, today their BP is BP: (!) 144/62 mmHg.  She does workout. She denies chest pain, shortness of breath, dizziness.   She is on cholesterol medication and denies myalgias. Her cholesterol is at goal. The cholesterol last visit was:  Lab Results  Component Value Date   CHOL 196 04/22/2015   HDL 58 04/22/2015   LDLCALC 111 04/22/2015   TRIG 137 04/22/2015   CHOLHDL 3.4 04/22/2015  .  She has been working on diet and exercise for prediabetes, she is on bASA, she is on ACE/ARB and denies foot ulcerations, hyperglycemia, hypoglycemia , increased appetite, nausea, paresthesia of the feet, polydipsia, polyuria, visual disturbances, vomiting and weight loss. Last A1C in the office was:  Lab Results  Component Value Date   HGBA1C 6.1* 04/22/2015    Patient is on Vitamin D supplement.   Lab Results  Component Value Date   VD25OH 21* 04/22/2015     She reports that for the past 2 weeks she has had a dry cough,  congestion, and clear rhinorrhea.  It seems to be the same.  She has tried taking sudafed and that didn't help much.   She would like to have her cataract surgery for her right eye in May.    Current Medications:  Current Outpatient Prescriptions on File Prior to Visit  Medication Sig Dispense Refill  . amLODipine (NORVASC) 10 MG tablet TAKE 1 TABLET EVERY DAY 90 tablet 99  . atenolol (TENORMIN) 100 MG tablet TAKE 1 TABLET EVERY DAY 90 tablet 2  . Blood Glucose Monitoring Suppl (ONE TOUCH ULTRA SYSTEM KIT) W/DEVICE KIT 1 kit by Does not apply route once. Check glucose 1 time daily-DX-E11.2 1 each 0  . glucose blood (ONE TOUCH ULTRA TEST) test strip Check glucose 1 time daily-DX-E11.2 100 each PRN  . Lancets (ONETOUCH ULTRASOFT) lancets Check glucose 1 time daily-DX-E11.2 100 each PRN  . metFORMIN (GLUCOPHAGE-XR) 500 MG 24 hr tablet Take 4 tablets daily or as directed for Diabetes 360 tablet 99  . quinapril (ACCUPRIL) 40 MG tablet TAKE 1 TABLET BY MOUTH DAILY 90 tablet 1  . ranitidine (ZANTAC) 300 MG tablet Take 1 tablet (300 mg total) by mouth at bedtime. 30 tablet 1  . TRAVATAN Z 0.004 % SOLN ophthalmic solution Place 1 drop into both eyes at bedtime.      No current facility-administered medications on file prior to visit.    Health Maintenance:   Immunization History  Administered Date(s) Administered  .  Influenza Split 05/01/2013  . Influenza, High Dose Seasonal PF 03/31/2014, 04/22/2015  . Pneumococcal Conjugate-13 07/03/2014  . Pneumococcal Polysaccharide-23 08/05/1998  . Td 06/17/2012    Tetanus: 2013 Pneumovax: 2000 Flu vaccine: 2016 Zostavax: recommending calling insurance company. MGM: 03/11/15  DEXA: 6384 Colonoscopy: 2011 Last Dental Exam:  Dr. Bennye Alm, twice yearly Last Eye Exam:  Dr. Kathrin Penner  Patient Care Team: Unk Pinto, MD as PCP - General (Internal Medicine) Shon Hough, MD as Consulting Physician (Ophthalmology) Lafayette Dragon, MD as  Consulting Physician (Gastroenterology) Gean Birchwood, DPM as Consulting Physician (Podiatry)  Allergies:  Allergies  Allergen Reactions  . Buspirone   . Clonidine Derivatives   . Cymbalta [Duloxetine Hcl]   . Elavil [Amitriptyline]   . Hytrin [Terazosin]   . Nizofenone   . Paxil [Paroxetine Hcl]   . Prozac [Fluoxetine Hcl]   . Zoloft [Sertraline Hcl]     Medical History:  Past Medical History  Diagnosis Date  . Hyperlipidemia   . Hypertension   . Vitamin D deficiency   . GERD (gastroesophageal reflux disease)   . Type II or unspecified type diabetes mellitus without mention of complication, not stated as uncontrolled   . Depression     Surgical History:  Past Surgical History  Procedure Laterality Date  . Cholecystectomy    . Total abdominal hysterectomy w/ bilateral salpingoophorectomy  1990    Family History:  Family History  Problem Relation Age of Onset  . Hypertension Mother   . Heart attack Mother   . Ulcers Mother   . CVA Father   . Alzheimer's disease Father   . Hypertension Father     Social History:  Social History  Substance Use Topics  . Smoking status: Never Smoker   . Smokeless tobacco: None  . Alcohol Use: No    Review of Systems: Review of Systems  Constitutional: Negative for fever, chills and malaise/fatigue.  HENT: Positive for congestion. Negative for ear pain.   Respiratory: Positive for cough. Negative for sputum production, shortness of breath and wheezing.   Cardiovascular: Negative for chest pain, palpitations and leg swelling.  Gastrointestinal: Negative for heartburn, diarrhea, constipation, blood in stool and melena.  Genitourinary: Negative.   Neurological: Negative for dizziness, sensory change, loss of consciousness and headaches.  Psychiatric/Behavioral: Negative for depression. The patient is nervous/anxious. The patient does not have insomnia.     Physical Exam: Estimated body mass index is 20.05 kg/(m^2) as  calculated from the following:   Height as of this encounter: 5' 3.5" (1.613 m).   Weight as of this encounter: 115 lb (52.164 kg). BP 144/62 mmHg  Pulse 58  Temp(Src) 98.2 F (36.8 C) (Temporal)  Resp 16  Ht 5' 3.5" (1.613 m)  Wt 115 lb (52.164 kg)  BMI 20.05 kg/m2  General Appearance: Well nourished well developed, in no apparent distress.  Eyes: PERRLA, EOMs, conjunctiva no swelling or erythema ENT/Mouth: Ear canals normal without obstruction, swelling, erythema, or discharge.  TMs normal bilaterally with no erythema, bulging, retraction, or loss of landmark.  Oropharynx moist and clear with no exudate, erythema, or swelling.   Neck: Supple, thyroid normal. No bruits.  No cervical adenopathy Respiratory: Respiratory effort normal, Breath sounds clear A&P without wheeze, rhonchi, rales. Cardio: RRR without murmurs, rubs or gallops. Brisk peripheral pulses without edema.  Chest: symmetric, with normal excursions Breasts: Symmetric, without lumps, nipple discharge, retractions.  Abdomen: Soft, nontender, no guarding, rebound, hernias, masses, or organomegaly.  Lymphatics: Non tender without lymphadenopathy.  Genitourinary:  Musculoskeletal: Full ROM all peripheral extremities,5/5 strength, and normal gait.  Skin: Warm, dry without rashes, lesions, ecchymosis.  1 in irregular dark pigmented mole on the right breast.  Irregular raised dark pigmented lesion on the left lat. Neuro: Awake and oriented X 3, Cranial nerves intact, reflexes equal bilaterally. Normal muscle tone, no cerebellar symptoms. Sensation intact.  Psych:  normal affect, Insight and Judgment appropriate.   EKG: WNL no changes.  Over 40 minutes of exam, counseling, chart review and critical decision making was performed  Loma Sousa Forcucci 10:12 AM [facility]

## 2015-07-29 NOTE — Patient Instructions (Signed)
Preventive Care for Adults  A healthy lifestyle and preventive care can promote health and wellness. Preventive health guidelines for women include the following key practices.  A routine yearly physical is a good way to check with your health care provider about your health and preventive screening. It is a chance to share any concerns and updates on your health and to receive a thorough exam.  Visit your dentist for a routine exam and preventive care every 6 months. Brush your teeth twice a day and floss once a day. Good oral hygiene prevents tooth decay and gum disease.  The frequency of eye exams is based on your age, health, family medical history, use of contact lenses, and other factors. Follow your health care provider's recommendations for frequency of eye exams.  Eat a healthy diet. Foods like vegetables, fruits, whole grains, low-fat dairy products, and lean protein foods contain the nutrients you need without too many calories. Decrease your intake of foods high in solid fats, added sugars, and salt. Eat the right amount of calories for you.Get information about a proper diet from your health care provider, if necessary.  Regular physical exercise is one of the most important things you can do for your health. Most adults should get at least 150 minutes of moderate-intensity exercise (any activity that increases your heart rate and causes you to sweat) each week. In addition, most adults need muscle-strengthening exercises on 2 or more days a week.  Maintain a healthy weight. The body mass index (BMI) is a screening tool to identify possible weight problems. It provides an estimate of body fat based on height and weight. Your health care provider can find your BMI and can help you achieve or maintain a healthy weight.For adults 20 years and older:  A BMI below 18.5 is considered underweight.  A BMI of 18.5 to 24.9 is normal.  A BMI of 25 to 29.9 is considered overweight.  A BMI  of 30 and above is considered obese.  Maintain normal blood lipids and cholesterol levels by exercising and minimizing your intake of saturated fat. Eat a balanced diet with plenty of fruit and vegetables. If your lipid or cholesterol levels are high, you are over 50, or you are at high risk for heart disease, you may need your cholesterol levels checked more frequently.Ongoing high lipid and cholesterol levels should be treated with medicines if diet and exercise are not working.  If you smoke, find out from your health care provider how to quit. If you do not use tobacco, do not start.  Lung cancer screening is recommended for adults aged 2-80 years who are at high risk for developing lung cancer because of a history of smoking. A yearly low-dose CT scan of the lungs is recommended for people who have at least a 30-pack-year history of smoking and are a current smoker or have quit within the past 15 years. A pack year of smoking is smoking an average of 1 pack of cigarettes a day for 1 year (for example: 1 pack a day for 30 years or 2 packs a day for 15 years). Yearly screening should continue until the smoker has stopped smoking for at least 15 years. Yearly screening should be stopped for people who develop a health problem that would prevent them from having lung cancer treatment.  Avoid use of street drugs. Do not share needles with anyone. Ask for help if you need support or instructions about stopping the use of drugs.  High  blood pressure causes heart disease and increases the risk of stroke.  Ongoing high blood pressure should be treated with medicines if weight loss and exercise do not work.  If you are 55-79 years old, ask your health care provider if you should take aspirin to prevent strokes.  Diabetes screening involves taking a blood sample to check your fasting blood sugar level. This should be done once every 3 years, after age 45, if you are within normal weight and without risk  factors for diabetes. Testing should be considered at a younger age or be carried out more frequently if you are overweight and have at least 1 risk factor for diabetes.  Breast cancer screening is essential preventive care for women. You should practice "breast self-awareness." This means understanding the normal appearance and feel of your breasts and may include breast self-examination. Any changes detected, no matter how small, should be reported to a health care provider. Women in their 20s and 30s should have a clinical breast exam (CBE) by a health care provider as part of a regular health exam every 1 to 3 years. After age 40, women should have a CBE every year. Starting at age 40, women should consider having a mammogram (breast X-ray test) every year. Women who have a family history of breast cancer should talk to their health care provider about genetic screening. Women at a high risk of breast cancer should talk to their health care providers about having an MRI and a mammogram every year.  Breast cancer gene (BRCA)-related cancer risk assessment is recommended for women who have family members with BRCA-related cancers. BRCA-related cancers include breast, ovarian, tubal, and peritoneal cancers. Having family members with these cancers may be associated with an increased risk for harmful changes (mutations) in the breast cancer genes BRCA1 and BRCA2. Results of the assessment will determine the need for genetic counseling and BRCA1 and BRCA2 testing.  Routine pelvic exams to screen for cancer are no longer recommended for nonpregnant women who are considered low risk for cancer of the pelvic organs (ovaries, uterus, and vagina) and who do not have symptoms. Ask your health care provider if a screening pelvic exam is right for you.  If you have had past treatment for cervical cancer or a condition that could lead to cancer, you need Pap tests and screening for cancer for at least 20 years after  your treatment. If Pap tests have been discontinued, your risk factors (such as having a new sexual partner) need to be reassessed to determine if screening should be resumed. Some women have medical problems that increase the chance of getting cervical cancer. In these cases, your health care provider may recommend more frequent screening and Pap tests.    Colorectal cancer can be detected and often prevented. Most routine colorectal cancer screening begins at the age of 50 years and continues through age 75 years. However, your health care provider may recommend screening at an earlier age if you have risk factors for colon cancer. On a yearly basis, your health care provider may provide home test kits to check for hidden blood in the stool. Use of a small camera at the end of a tube, to directly examine the colon (sigmoidoscopy or colonoscopy), can detect the earliest forms of colorectal cancer. Talk to your health care provider about this at age 50, when routine screening begins. Direct exam of the colon should be repeated every 5-10 years through age 75 years, unless early forms of pre-cancerous   polyps or small growths are found.  Osteoporosis is a disease in which the bones lose minerals and strength with aging. This can result in serious bone fractures or breaks. The risk of osteoporosis can be identified using a bone density scan. Women ages 68 years and over and women at risk for fractures or osteoporosis should discuss screening with their health care providers. Ask your health care provider whether you should take a calcium supplement or vitamin D to reduce the rate of osteoporosis.  Menopause can be associated with physical symptoms and risks. Hormone replacement therapy is available to decrease symptoms and risks. You should talk to your health care provider about whether hormone replacement therapy is right for you.  Use sunscreen. Apply sunscreen liberally and repeatedly throughout the day.  You should seek shade when your shadow is shorter than you. Protect yourself by wearing long sleeves, pants, a wide-brimmed hat, and sunglasses year round, whenever you are outdoors.  Once a month, do a whole body skin exam, using a mirror to look at the skin on your back. Tell your health care provider of new moles, moles that have irregular borders, moles that are larger than a pencil eraser, or moles that have changed in shape or color.  Stay current with required vaccines (immunizations).  Influenza vaccine. All adults should be immunized every year.  Tetanus, diphtheria, and acellular pertussis (Td, Tdap) vaccine. Pregnant women should receive 1 dose of Tdap vaccine during each pregnancy. The dose should be obtained regardless of the length of time since the last dose. Immunization is preferred during the 27th-36th week of gestation. An adult who has not previously received Tdap or who does not know her vaccine status should receive 1 dose of Tdap. This initial dose should be followed by tetanus and diphtheria toxoids (Td) booster doses every 10 years. Adults with an unknown or incomplete history of completing a 3-dose immunization series with Td-containing vaccines should begin or complete a primary immunization series including a Tdap dose. Adults should receive a Td booster every 10 years.    Zoster vaccine. One dose is recommended for adults aged 36 years or older unless certain conditions are present.    Pneumococcal 13-valent conjugate (PCV13) vaccine. When indicated, a person who is uncertain of her immunization history and has no record of immunization should receive the PCV13 vaccine. An adult aged 1 years or older who has certain medical conditions and has not been previously immunized should receive 1 dose of PCV13 vaccine. This PCV13 should be followed with a dose of pneumococcal polysaccharide (PPSV23) vaccine. The PPSV23 vaccine dose should be obtained at least 8 weeks after the  dose of PCV13 vaccine. An adult aged 73 years or older who has certain medical conditions and previously received 1 or more doses of PPSV23 vaccine should receive 1 dose of PCV13. The PCV13 vaccine dose should be obtained 1 or more years after the last PPSV23 vaccine dose.    Pneumococcal polysaccharide (PPSV23) vaccine. When PCV13 is also indicated, PCV13 should be obtained first. All adults aged 47 years and older should be immunized. An adult younger than age 86 years who has certain medical conditions should be immunized. Any person who resides in a nursing home or long-term care facility should be immunized. An adult smoker should be immunized. People with an immunocompromised condition and certain other conditions should receive both PCV13 and PPSV23 vaccines. People with human immunodeficiency virus (HIV) infection should be immunized as soon as possible after diagnosis. Immunization  during chemotherapy or radiation therapy should be avoided. Routine use of PPSV23 vaccine is not recommended for American Indians, [place]atives, or people younger than 65 years unless there are medical conditions that require PPSV23 vaccine. When indicated, people who have unknown immunization and have no record of immunization should receive PPSV23 vaccine. One-time revaccination 5 years after the first dose of PPSV23 is recommended for people aged 19-64 years who have chronic kidney failure, nephrotic syndrome, asplenia, or immunocompromised conditions. People who received 1-2 doses of PPSV23 before age 52 years should receive another dose of PPSV23 vaccine at age 7 years or later if at least 5 years have passed since the previous dose. Doses of PPSV23 are not needed for people immunized with PPSV23 at or after age 33 years.   Preventive Services / Frequency  Ages 24 years and over  Blood pressure check.  Lipid and cholesterol check.  Lung cancer screening. / Every year if you are aged 57-80 years and have a  30-pack-year history of smoking and currently smoke or have quit within the past 15 years. Yearly screening is stopped once you have quit smoking for at least 15 years or develop a health problem that would prevent you from having lung cancer treatment.  Clinical breast exam.** / Every year after age 108 years.  BRCA-related cancer risk assessment.** / For women who have family members with a BRCA-related cancer (breast, ovarian, tubal, or peritoneal cancers).  Mammogram.** / Every year beginning at age 95 years and continuing for as long as you are in good health. Consult with your health care provider.  Pap test.** / Every 3 years starting at age 45 years through age 52 or 15 years with 3 consecutive normal Pap tests. Testing can be stopped between 65 and 70 years with 3 consecutive normal Pap tests and no abnormal Pap or HPV tests in the past 10 years.  Fecal occult blood test (FOBT) of stool. / Every year beginning at age 89 years and continuing until age 17 years. You may not need to do this test if you get a colonoscopy every 10 years.  Flexible sigmoidoscopy or colonoscopy.** / Every 5 years for a flexible sigmoidoscopy or every 10 years for a colonoscopy beginning at age 87 years and continuing until age 60 years.  Hepatitis C blood test.** / For all people born from [birth year] through 1964 and any individual with known risks for hepatitis C.  Osteoporosis screening.** / A one-time screening for women ages 52 years and over and women at risk for fractures or osteoporosis.  Skin self-exam. / Monthly.  Influenza vaccine. / Every year.  Tetanus, diphtheria, and acellular pertussis (Tdap/Td) vaccine.** / 1 dose of Td every 10 years.  Zoster vaccine.** / 1 dose for adults aged 42 years or older.  Pneumococcal 13-valent conjugate (PCV13) vaccine.** / Consult your health care provider.  Pneumococcal polysaccharide (PPSV23) vaccine.** / 1 dose for all adults aged 37 years and older. Screening  for abdominal aortic aneurysm (AAA)  by ultrasound is recommended for people who have history of high blood pressure or who are current or former smokers.

## 2015-07-29 NOTE — Addendum Note (Signed)
Addended by: Terri PiedraFORCUCCI, [place] Latka A on: 07/29/2015 10:38 AM   Modules accepted: Kipp BroodSmartSet

## 2015-07-30 LAB — HEMOGLOBIN A1C
Hgb A1c MFr Bld: 6.4 % — ABNORMAL HIGH (ref <5.7)
MEAN PLASMA GLUCOSE: 137 mg/dL — AB (ref <117)

## 2015-07-30 LAB — URINALYSIS, ROUTINE W REFLEX MICROSCOPIC
Bilirubin Urine: NEGATIVE
Glucose, UA: NEGATIVE
HGB URINE DIPSTICK: NEGATIVE
Ketones, ur: NEGATIVE
LEUKOCYTES UA: NEGATIVE
NITRITE: NEGATIVE
PH: 6 (ref 5.0–8.0)
Protein, ur: NEGATIVE
SPECIFIC GRAVITY, URINE: 1.009 (ref 1.001–1.035)

## 2015-07-30 LAB — MICROALBUMIN / CREATININE URINE RATIO
Creatinine, Urine: 37 mg/dL (ref 20–320)
MICROALB UR: 0.4 mg/dL
MICROALB/CREAT RATIO: 11 ug/mg{creat} (ref <30)

## 2015-07-30 LAB — VITAMIN B12: Vitamin B-12: 544 pg/mL (ref 211–911)

## 2015-07-30 LAB — VITAMIN D 25 HYDROXY (VIT D DEFICIENCY, FRACTURES): Vit D, 25-Hydroxy: 15 ng/mL — ABNORMAL LOW (ref 30–100)

## 2015-07-30 LAB — TSH: TSH: 0.782 u[IU]/mL (ref 0.350–4.500)

## 2015-07-30 LAB — INSULIN, RANDOM: INSULIN: 3.3 u[IU]/mL (ref 2.0–19.6)

## 2015-09-14 IMAGING — CT CT CERVICAL SPINE W/O CM
5 of 8 series · 14 of 33 positions shown, 15 images · non-contrast
Comparison: None.

CLINICAL DATA: Fall.

EXAM:
CT HEAD WITHOUT CONTRAST
CT MAXILLOFACIAL WITHOUT CONTRAST
CT CERVICAL SPINE WITHOUT CONTRAST
TECHNIQUE: Multidetector CT imaging of the head, cervical spine, and
maxillofacial structures were performed using the standard protocol
without intravenous contrast. Multiplanar CT image reconstructions
of the cervical spine and maxillofacial structures were also
generated.

[Series 5: facial/ orbits 2.0 h30s · axial · 0.36mm/px · z∈[-132,-80]mm · 2 of 78 slices shown, 3 images]
[im 26/78  soft-tissue]
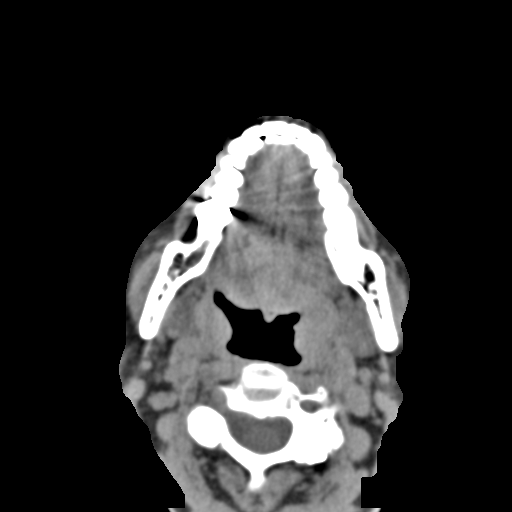
[im 26/78  bone]
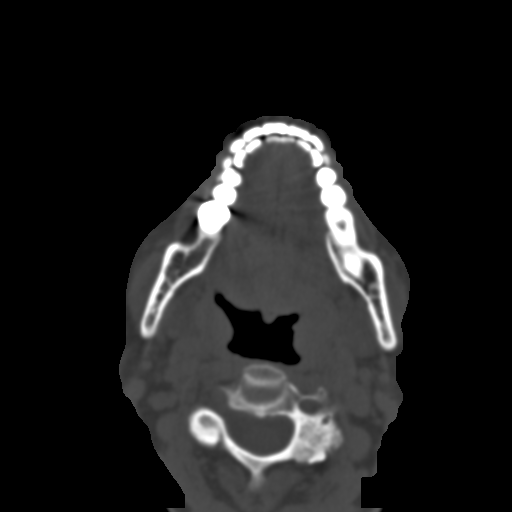
[im 52/78  bone]
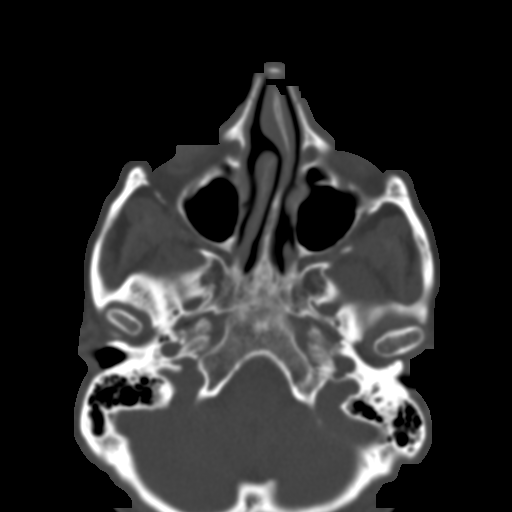

[Series 10: c_spine 2.0 i40s 3 · axial · 0.29mm/px · z∈[-176,-122]mm · 2 of 81 slices shown]
[im 27/81  bone]
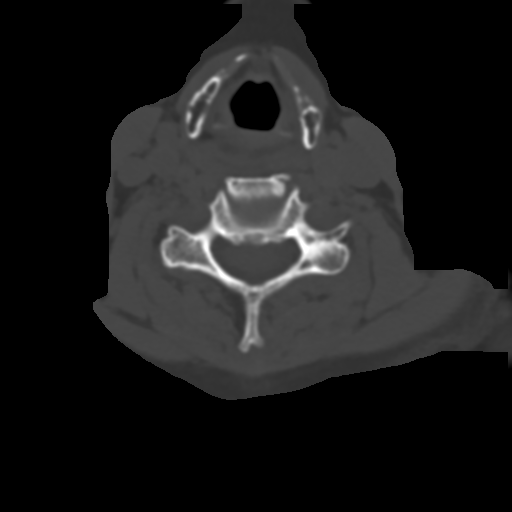
[im 54/81  bone]
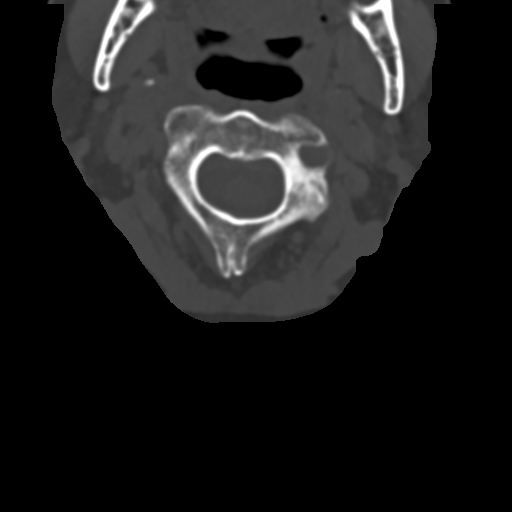

[Series 602: st cor · coronal · 0.36mm/px · 3 of 83 slices shown]
[im 21/83  bone]
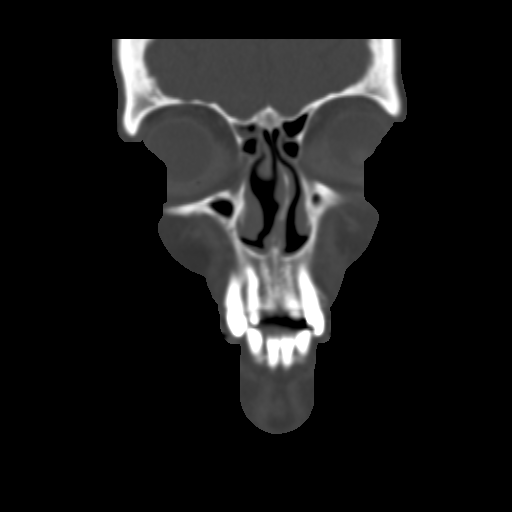
[im 42/83  bone]
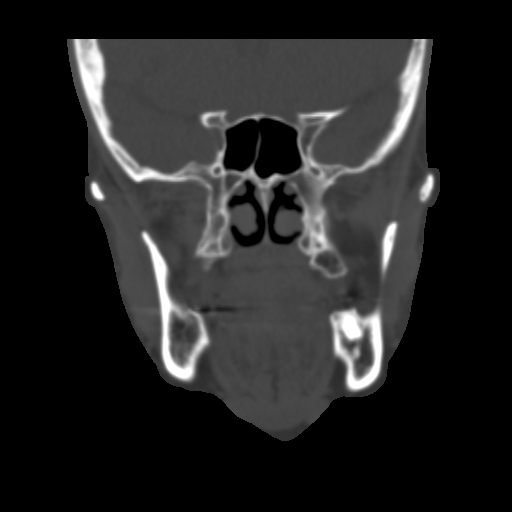
[im 62/83  bone]
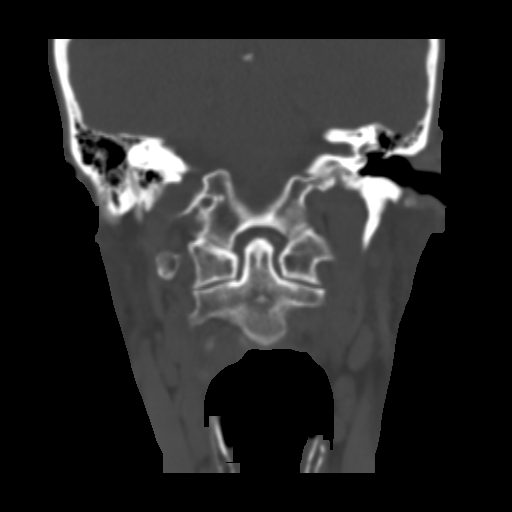

[Series 603: st sag · sagittal · 0.36mm/px · 5 of 77 slices shown]
[im 11/77  bone]
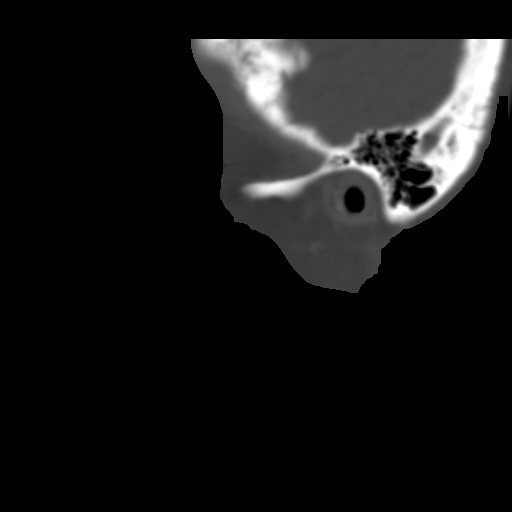
[im 22/77  bone]
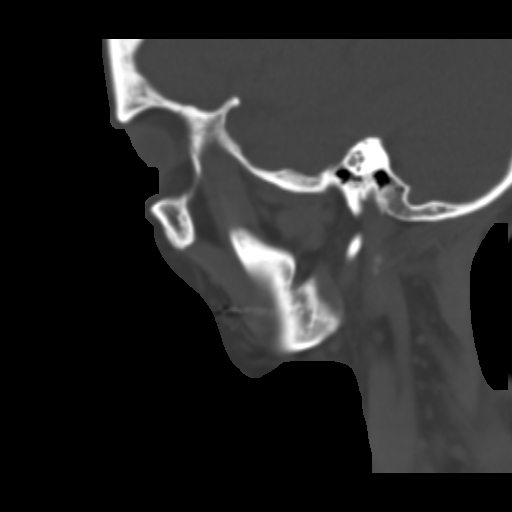
[im 33/77  bone]
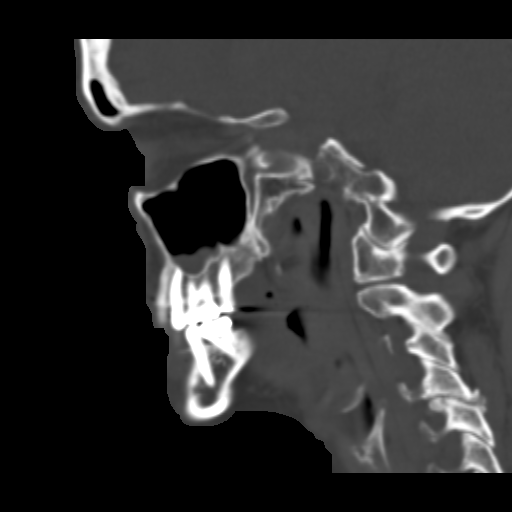
[im 44/77  bone]
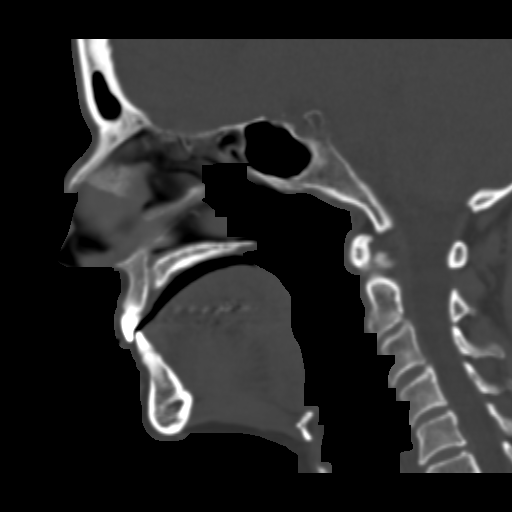
[im 55/77  bone]
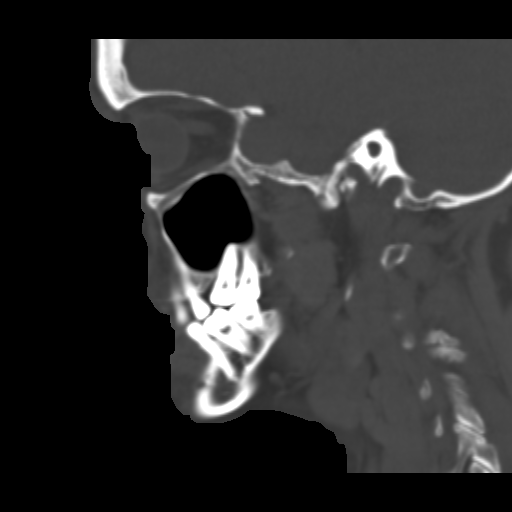

[Series 606: orthog · axial · 0.32mm/px · z∈[-199,-146]mm · 2 of 85 slices shown]
[im 29/85  bone]
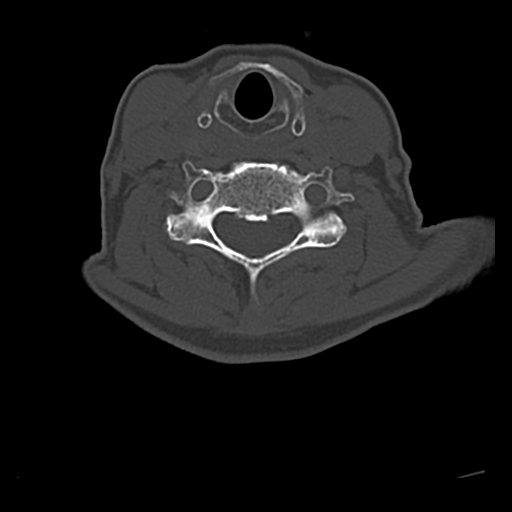
[im 57/85  bone]
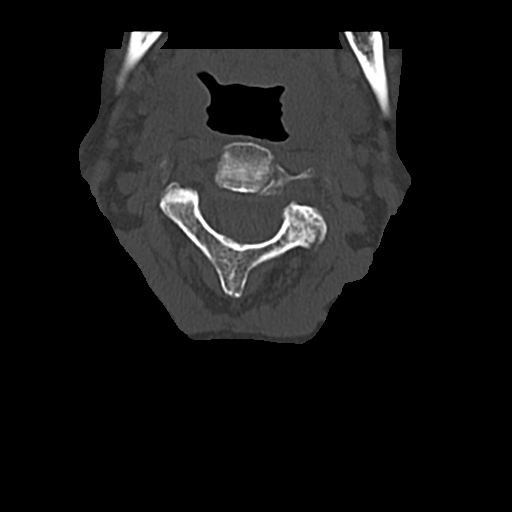

[14 of 33 positions shown; findings below may reference images not displayed]

FINDINGS: CT HEAD FINDINGS

Left frontal/supraorbital subcutaneous hematoma without underlying
fracture or intracranial hemorrhage.

Small vessel disease type changes without CT evidence of large acute
infarct.

No hydrocephalus.

No intracranial mass lesion noted on this unenhanced exam.

Orbital structures appear intact.

CT MAXILLOFACIAL FINDINGS

Minimal incongruity of the left nasal bone and therefore subtle
fracture not excluded in the proper clinical setting. No other
facial fracture is noted.

Minimal polypoid opacification inferior aspect of the right
maxillary sinus.

CT CERVICAL SPINE FINDINGS

No cervical spine fracture.

Straightening of the cervical spine may be related to spasm or head
position. If there is a high clinical suspicion of ligamentous
injury, flexion and extension views or MR can be performed for
further delineation.

No abnormal prevertebral soft tissue swelling.

Cervical spondylotic changes most prominent C4-5 thru C6-7. Facet
joint degenerative changes most notable on the right at the C4-5
level and on the left at the C2-3 and C3-4 level.

Heterogeneous mixed density lesions of the thyroid gland. Thyroid
ultrasound can be obtained for further delineation.
IMPRESSION: Head CT:

Left frontal/supraorbital subcutaneous hematoma without underlying
fracture or intracranial hemorrhage.

CT of maxillofacial:

Minimal incongruity of the left nasal bone and therefore subtle
fracture not excluded in the proper clinical setting. No other
facial fracture is noted.

CT cervical spine:

No cervical spine fracture.

Straightening of the cervical spine may be related to spasm or head
position.

No abnormal prevertebral soft tissue swelling.

Cervical spondylotic changes most prominent C4-5 thru C6-7. Facet
joint degenerative changes most notable on the right at the C4-5
level and on the left at the C2-3 and C3-4 level.

Heterogeneous mixed density lesions of the thyroid gland. Thyroid
ultrasound can be obtained for further delineation.

## 2015-09-21 ENCOUNTER — Encounter: Payer: Self-pay | Admitting: Podiatry

## 2015-09-21 ENCOUNTER — Ambulatory Visit (INDEPENDENT_AMBULATORY_CARE_PROVIDER_SITE_OTHER): Payer: Medicare Other | Admitting: Podiatry

## 2015-09-21 DIAGNOSIS — B351 Tinea unguium: Secondary | ICD-10-CM | POA: Diagnosis not present

## 2015-09-21 DIAGNOSIS — M79676 Pain in unspecified toe(s): Secondary | ICD-10-CM

## 2015-09-21 NOTE — Progress Notes (Signed)
Patient ID: Melissa Davis, female   DOB: [birth year], 75 y.o.   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

## 2015-09-21 NOTE — Patient Instructions (Signed)
Diabetes and Foot Care Diabetes may cause you to have problems because of poor blood supply (circulation) to your feet and legs. This may cause the skin on your feet to become thinner, break easier, and heal more slowly. Your skin may become dry, and the skin may peel and crack. You may also have nerve damage in your legs and feet causing decreased feeling in them. You may not notice minor injuries to your feet that could lead to infections or more serious problems. Taking care of your feet is one of the most important things you can do for yourself.  HOME CARE INSTRUCTIONS  Wear shoes at all times, even in the house. Do not go barefoot. Bare feet are easily injured.  Check your feet daily for blisters, cuts, and redness. If you cannot see the bottom of your feet, use a mirror or ask someone for help.  Wash your feet with warm water (do not use hot water) and mild soap. Then pat your feet and the areas between your toes until they are completely dry. Do not soak your feet as this can dry your skin.  Apply a moisturizing lotion or petroleum jelly (that does not contain alcohol and is unscented) to the skin on your feet and to dry, brittle toenails. Do not apply lotion between your toes.  Trim your toenails straight across. Do not dig under them or around the cuticle. File the edges of your nails with an emery board or nail file.  Do not cut corns or calluses or try to remove them with medicine.  Wear clean socks or stockings every day. Make sure they are not too tight. Do not wear knee-high stockings since they may decrease blood flow to your legs.  Wear shoes that fit properly and have enough cushioning. To break in new shoes, wear them for just a few hours a day. This prevents you from injuring your feet. Always look in your shoes before you put them on to be sure there are no objects inside.  Do not cross your legs. This may decrease the blood flow to your feet.  If you find a minor scrape,  cut, or break in the skin on your feet, keep it and the skin around it clean and dry. These areas may be cleansed with mild soap and water. Do not cleanse the area with peroxide, alcohol, or iodine.  When you remove an adhesive bandage, be sure not to damage the skin around it.  If you have a wound, look at it several times a day to make sure it is healing.  Do not use heating pads or hot water bottles. They may burn your skin. If you have lost feeling in your feet or legs, you may not know it is happening until it is too late.  Make sure your health care provider performs a complete foot exam at least annually or more often if you have foot problems. Report any cuts, sores, or bruises to your health care provider immediately. SEEK MEDICAL CARE IF:   You have an injury that is not healing.  You have cuts or breaks in the skin.  You have an ingrown nail.  You notice redness on your legs or feet.  You feel burning or tingling in your legs or feet.  You have pain or cramps in your legs and feet.  Your legs or feet are numb.  Your feet always feel cold. SEEK IMMEDIATE MEDICAL CARE IF:   There is increasing redness,   swelling, or pain in or around a wound.  There is a red line that goes up your leg.  Pus is coming from a wound.  You develop a fever or as directed by your health care provider.  You notice a bad smell coming from an ulcer or wound.   This information is not intended to replace advice given to you by your health care provider. Make sure you discuss any questions you have with your health care provider.   Document Released: 06/30/2000 Document Revised: 03/05/2013 Document Reviewed: 12/10/2012 [employer] Interactive Patient Education 2016 [employer]  

## 2015-09-22 ENCOUNTER — Telehealth: Payer: Self-pay | Admitting: *Deleted

## 2015-09-22 NOTE — Telephone Encounter (Signed)
Patient called and complained of diarrhea during the night and asked if she can take Pepto Bismol .  Per Dr Oneta RackMcKeown, she can Pepto Bismol or Immodium, up to 12 tabs daily.  Patient is aware.

## 2015-10-11 ENCOUNTER — Other Ambulatory Visit: Payer: Self-pay | Admitting: Internal Medicine

## 2015-10-11 NOTE — Telephone Encounter (Signed)
RX CALLED INTO [facility]. 

## 2015-10-27 ENCOUNTER — Other Ambulatory Visit: Payer: Self-pay | Admitting: Internal Medicine

## 2015-10-28 ENCOUNTER — Ambulatory Visit (INDEPENDENT_AMBULATORY_CARE_PROVIDER_SITE_OTHER): Payer: Medicare Other | Admitting: Internal Medicine

## 2015-10-28 VITALS — BP 140/62 | HR 68 | Temp 97.8°F | Resp 16 | Wt 114.0 lb

## 2015-10-28 DIAGNOSIS — E785 Hyperlipidemia, unspecified: Secondary | ICD-10-CM | POA: Diagnosis not present

## 2015-10-28 DIAGNOSIS — E119 Type 2 diabetes mellitus without complications: Secondary | ICD-10-CM | POA: Diagnosis not present

## 2015-10-28 DIAGNOSIS — I10 Essential (primary) hypertension: Secondary | ICD-10-CM | POA: Diagnosis not present

## 2015-10-28 DIAGNOSIS — Z79899 Other long term (current) drug therapy: Secondary | ICD-10-CM

## 2015-10-28 DIAGNOSIS — E559 Vitamin D deficiency, unspecified: Secondary | ICD-10-CM | POA: Diagnosis not present

## 2015-10-28 DIAGNOSIS — F418 Other specified anxiety disorders: Secondary | ICD-10-CM | POA: Diagnosis not present

## 2015-10-28 LAB — CBC WITH DIFFERENTIAL/PLATELET
BASOS ABS: 0 {cells}/uL (ref 0–200)
Basophils Relative: 0 %
EOS ABS: 72 {cells}/uL (ref 15–500)
Eosinophils Relative: 1 %
HCT: 39.3 % (ref 35.0–45.0)
Hemoglobin: 13.3 g/dL (ref 11.7–15.5)
LYMPHS PCT: 21 %
Lymphs Abs: 1512 cells/uL (ref 850–3900)
MCH: 30.7 pg (ref 27.0–33.0)
MCHC: 33.8 g/dL (ref 32.0–36.0)
MCV: 90.8 fL (ref 80.0–100.0)
MONOS PCT: 6 %
MPV: 9.6 fL (ref 7.5–12.5)
Monocytes Absolute: 432 cells/uL (ref 200–950)
NEUTROS ABS: 5184 {cells}/uL (ref 1500–7800)
Neutrophils Relative %: 72 %
PLATELETS: 263 10*3/uL (ref 140–400)
RBC: 4.33 MIL/uL (ref 3.80–5.10)
RDW: 13.5 % (ref 11.0–15.0)
WBC: 7.2 10*3/uL (ref 3.8–10.8)

## 2015-10-28 LAB — TSH: TSH: 0.68 m[IU]/L

## 2015-10-28 LAB — LIPID PANEL
Cholesterol: 162 mg/dL (ref 125–200)
HDL: 54 mg/dL (ref >=46)
LDL CALC: 86 mg/dL (ref <130)
TRIGLYCERIDES: 111 mg/dL (ref <150)
Total CHOL/HDL Ratio: 3 Ratio (ref <=5.0)
VLDL: 22 mg/dL (ref <30)

## 2015-10-28 LAB — BASIC METABOLIC PANEL WITH GFR
BUN: 16 mg/dL (ref 7–25)
CALCIUM: 9.2 mg/dL (ref 8.6–10.4)
CHLORIDE: 103 mmol/L (ref 98–110)
CO2: 24 mmol/L (ref 20–31)
Creat: 0.73 mg/dL (ref 0.60–0.93)
GFR, Est African American: >89 mL/min (ref >=60)
GFR, Est Non African American: 82 mL/min (ref >=60)
GLUCOSE: 92 mg/dL (ref 65–99)
Potassium: 4.3 mmol/L (ref 3.5–5.3)
Sodium: 142 mmol/L (ref 135–146)

## 2015-10-28 LAB — HEPATIC FUNCTION PANEL
ALBUMIN: 4.2 g/dL (ref 3.6–5.1)
ALK PHOS: 70 U/L (ref 33–130)
ALT: 12 U/L (ref 6–29)
AST: 15 U/L (ref 10–35)
BILIRUBIN INDIRECT: 0.7 mg/dL (ref 0.2–1.2)
Bilirubin, Direct: 0.1 mg/dL (ref <=0.2)
TOTAL PROTEIN: 6.8 g/dL (ref 6.1–8.1)
Total Bilirubin: 0.8 mg/dL (ref 0.2–1.2)

## 2015-10-28 MED ORDER — CLORAZEPATE DIPOTASSIUM 7.5 MG PO TABS: 7.5000 mg | ORAL_TABLET | Freq: Two times a day (BID) | ORAL | Status: DC | PRN

## 2015-10-28 NOTE — Progress Notes (Signed)
Assessment and Plan:  Hypertension:  -Continue medication,  -monitor blood pressure at home.  -Continue DASH diet.   -Reminder to go to the ER if any CP, SOB, nausea, dizziness, severe HA, changes vision/speech, left arm numbness and tingling, and jaw pain.  Cholesterol: -Continue diet and exercise.  -Check cholesterol.   Pre-diabetes: -Continue diet and exercise.  -Check A1C  Vitamin D Def: -check level -continue medications.   Situational anxiety -traxene prn  Continue diet and meds as discussed. Further disposition pending results of labs.  HPI 75 y.o. female  presents for 3 month follow up with hypertension, hyperlipidemia, prediabetes and vitamin D.   Her blood pressure has been controlled at home, today their BP is BP: 140/62 mmHg.   She does workout. She denies chest pain, shortness of breath, dizziness.  She does not check BP at home.   She is not on cholesterol medication and denies myalgias. Her cholesterol is at goal. The cholesterol last visit was:   Lab Results  Component Value Date   CHOL 184 07/29/2015   HDL 62 07/29/2015   LDLCALC 100 07/29/2015   TRIG 112 07/29/2015   CHOLHDL 3.0 07/29/2015     She has been working on diet and exercise for prediabetes, and denies foot ulcerations, hyperglycemia, hypoglycemia , increased appetite, nausea, paresthesia of the feet, polydipsia, polyuria, visual disturbances, vomiting and weight loss. Last A1C in the office was:  Lab Results  Component Value Date   HGBA1C 6.4* 07/29/2015    Patient is on Vitamin D supplement.  Lab Results  Component Value Date   VD25OH 15* 07/29/2015     She is doing well at home, although she is having increased stress secondary to her family living at her house.  She has her granddaughter and great grand children living with her.  She has taking tranxene in the past for anxiety but has been out of the medication.    Current Medications:  Current Outpatient Prescriptions on File Prior  to Visit  Medication Sig Dispense Refill  . amLODipine (NORVASC) 10 MG tablet TAKE 1 TABLET EVERY DAY 90 tablet 99  . atenolol (TENORMIN) 100 MG tablet TAKE 1 TABLET EVERY DAY 90 tablet 2  . Blood Glucose Monitoring Suppl (ONE TOUCH ULTRA SYSTEM KIT) W/DEVICE KIT 1 kit by Does not apply route once. Check glucose 1 time daily-DX-E11.2 1 each 0  . glucose blood (ONE TOUCH ULTRA TEST) test strip Check glucose 1 time daily-DX-E11.2 100 each PRN  . Lancets (ONETOUCH ULTRASOFT) lancets Check glucose 1 time daily-DX-E11.2 100 each PRN  . metFORMIN (GLUCOPHAGE-XR) 500 MG 24 hr tablet TAKE 4 TABLETS BY MOUTH DAILY OR AS DIRECTED FOR DIABETES 360 tablet 1  . quinapril (ACCUPRIL) 40 MG tablet TAKE 1 TABLET BY MOUTH EVERY DAY 90 tablet 0  . ranitidine (ZANTAC) 300 MG tablet Take 1 tablet (300 mg total) by mouth at bedtime. 30 tablet 1  . TRAVATAN Z 0.004 % SOLN ophthalmic solution Place 1 drop into both eyes at bedtime.      No current facility-administered medications on file prior to visit.    Medical History:  Past Medical History  Diagnosis Date  . Hyperlipidemia   . Hypertension   . Vitamin D deficiency   . GERD (gastroesophageal reflux disease)   . Type II or unspecified type diabetes mellitus without mention of complication, not stated as uncontrolled   . Depression     Allergies:  Allergies  Allergen Reactions  . Buspirone   .  Clonidine Derivatives   . Cymbalta [Duloxetine Hcl]   . Elavil [Amitriptyline]   . Hytrin [Terazosin]   . Nizofenone   . Paxil [Paroxetine Hcl]   . Prozac [Fluoxetine Hcl]   . Zoloft [Sertraline Hcl]      Review of Systems:  Review of Systems  Constitutional: Negative for fever, chills and malaise/fatigue.  HENT: Negative for congestion, ear pain and sore throat.   Eyes: Negative.   Respiratory: Negative for cough, shortness of breath and wheezing.   Cardiovascular: Negative for chest pain, palpitations and leg swelling.  Gastrointestinal: Negative  for heartburn, abdominal pain, diarrhea, constipation, blood in stool and melena.  Genitourinary: Negative.   Skin: Negative.   Neurological: Negative for dizziness, sensory change, loss of consciousness and headaches.  Psychiatric/Behavioral: Negative for depression. The patient is nervous/anxious. The patient does not have insomnia.     Family history- Review and unchanged  Social history- Review and unchanged  Physical Exam: BP 140/62 mmHg  Pulse 68  Temp(Src) 97.8 F (36.6 C)  Resp 16  Wt 114 lb (51.71 kg)  SpO2 97% Wt Readings from Last 3 Encounters:  10/28/15 114 lb (51.71 kg)  07/29/15 115 lb (52.164 kg)  04/22/15 112 lb 12.8 oz (51.166 kg)    General Appearance: Well nourished well developed, in no apparent distress. Eyes: PERRLA, EOMs, conjunctiva no swelling or erythema ENT/Mouth: Ear canals normal without obstruction, swelling, erythma, discharge.  TMs normal bilaterally.  Oropharynx moist, clear, without exudate, or postoropharyngeal swelling. Neck: Supple, thyroid normal,no cervical adenopathy  Respiratory: Respiratory effort normal, Breath sounds clear A&P without rhonchi, wheeze, or rale.  No retractions, no accessory usage. Cardio: RRR with no MRGs. Brisk peripheral pulses without edema.  Abdomen: Soft, + BS,  Non tender, no guarding, rebound, hernias, masses. Musculoskeletal: Full ROM, 5/5 strength, Normal gait Skin: Warm, dry without rashes, lesions, ecchymosis.  Neuro: Awake and oriented X 3, Cranial nerves intact. Normal muscle tone, no cerebellar symptoms. Psych: Normal affect, Insight and Judgment appropriate.    Starlyn Skeans, PA-C 12:59 PM [facility]

## 2015-10-29 LAB — HEMOGLOBIN A1C
HEMOGLOBIN A1C: 6.2 % — AB (ref <5.7)
Mean Plasma Glucose: 131 mg/dL

## 2015-12-04 ENCOUNTER — Encounter: Payer: Self-pay | Admitting: *Deleted

## 2015-12-22 ENCOUNTER — Encounter: Payer: Self-pay | Admitting: Podiatry

## 2015-12-22 ENCOUNTER — Ambulatory Visit (INDEPENDENT_AMBULATORY_CARE_PROVIDER_SITE_OTHER): Payer: Medicare Other | Admitting: Podiatry

## 2015-12-22 DIAGNOSIS — M79676 Pain in unspecified toe(s): Secondary | ICD-10-CM | POA: Diagnosis not present

## 2015-12-22 DIAGNOSIS — B351 Tinea unguium: Secondary | ICD-10-CM | POA: Diagnosis not present

## 2015-12-22 NOTE — Patient Instructions (Signed)
Diabetes and Foot Care Diabetes may cause you to have problems because of poor blood supply (circulation) to your feet and legs. This may cause the skin on your feet to become thinner, break easier, and heal more slowly. Your skin may become dry, and the skin may peel and crack. You may also have nerve damage in your legs and feet causing decreased feeling in them. You may not notice minor injuries to your feet that could lead to infections or more serious problems. Taking care of your feet is one of the most important things you can do for yourself.  HOME CARE INSTRUCTIONS  Wear shoes at all times, even in the house. Do not go barefoot. Bare feet are easily injured.  Check your feet daily for blisters, cuts, and redness. If you cannot see the bottom of your feet, use a mirror or ask someone for help.  Wash your feet with warm water (do not use hot water) and mild soap. Then pat your feet and the areas between your toes until they are completely dry. Do not soak your feet as this can dry your skin.  Apply a moisturizing lotion or petroleum jelly (that does not contain alcohol and is unscented) to the skin on your feet and to dry, brittle toenails. Do not apply lotion between your toes.  Trim your toenails straight across. Do not dig under them or around the cuticle. File the edges of your nails with an emery board or nail file.  Do not cut corns or calluses or try to remove them with medicine.  Wear clean socks or stockings every day. Make sure they are not too tight. Do not wear knee-high stockings since they may decrease blood flow to your legs.  Wear shoes that fit properly and have enough cushioning. To break in new shoes, wear them for just a few hours a day. This prevents you from injuring your feet. Always look in your shoes before you put them on to be sure there are no objects inside.  Do not cross your legs. This may decrease the blood flow to your feet.  If you find a minor scrape,  cut, or break in the skin on your feet, keep it and the skin around it clean and dry. These areas may be cleansed with mild soap and water. Do not cleanse the area with peroxide, alcohol, or iodine.  When you remove an adhesive bandage, be sure not to damage the skin around it.  If you have a wound, look at it several times a day to make sure it is healing.  Do not use heating pads or hot water bottles. They may burn your skin. If you have lost feeling in your feet or legs, you may not know it is happening until it is too late.  Make sure your health care provider performs a complete foot exam at least annually or more often if you have foot problems. Report any cuts, sores, or bruises to your health care provider immediately. SEEK MEDICAL CARE IF:   You have an injury that is not healing.  You have cuts or breaks in the skin.  You have an ingrown nail.  You notice redness on your legs or feet.  You feel burning or tingling in your legs or feet.  You have pain or cramps in your legs and feet.  Your legs or feet are numb.  Your feet always feel cold. SEEK IMMEDIATE MEDICAL CARE IF:   There is increasing redness,   swelling, or pain in or around a wound.  There is a red line that goes up your leg.  Pus is coming from a wound.  You develop a fever or as directed by your health care provider.  You notice a bad smell coming from an ulcer or wound.   This information is not intended to replace advice given to you by your health care provider. Make sure you discuss any questions you have with your health care provider.   Document Released: 06/30/2000 Document Revised: 03/05/2013 Document Reviewed: 12/10/2012 [employer] Interactive Patient Education 2016 [employer]  

## 2015-12-22 NOTE — Progress Notes (Signed)
Patient ID: Melissa Davis, female   DOB: Sep 19, 1940, 75 y.o.   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

## 2016-01-04 ENCOUNTER — Other Ambulatory Visit: Payer: Self-pay | Admitting: Internal Medicine

## 2016-01-22 ENCOUNTER — Other Ambulatory Visit: Payer: Self-pay | Admitting: Internal Medicine

## 2016-02-02 ENCOUNTER — Ambulatory Visit: Payer: Self-pay | Admitting: Internal Medicine

## 2016-02-03 ENCOUNTER — Encounter: Payer: Self-pay | Admitting: Physician Assistant

## 2016-02-03 ENCOUNTER — Ambulatory Visit: Payer: Self-pay | Admitting: Internal Medicine

## 2016-02-03 ENCOUNTER — Ambulatory Visit (INDEPENDENT_AMBULATORY_CARE_PROVIDER_SITE_OTHER): Payer: Medicare Other | Admitting: Physician Assistant

## 2016-02-03 VITALS — BP 122/64 | HR 52 | Temp 97.9°F | Resp 14 | Ht 63.5 in | Wt 117.0 lb

## 2016-02-03 DIAGNOSIS — F325 Major depressive disorder, single episode, in full remission: Secondary | ICD-10-CM | POA: Diagnosis not present

## 2016-02-03 DIAGNOSIS — M8580 Other specified disorders of bone density and structure, unspecified site: Secondary | ICD-10-CM | POA: Diagnosis not present

## 2016-02-03 DIAGNOSIS — E785 Hyperlipidemia, unspecified: Secondary | ICD-10-CM

## 2016-02-03 DIAGNOSIS — I10 Essential (primary) hypertension: Secondary | ICD-10-CM | POA: Diagnosis not present

## 2016-02-03 DIAGNOSIS — R6889 Other general symptoms and signs: Secondary | ICD-10-CM

## 2016-02-03 DIAGNOSIS — Z0000 Encounter for general adult medical examination without abnormal findings: Secondary | ICD-10-CM

## 2016-02-03 DIAGNOSIS — Z79899 Other long term (current) drug therapy: Secondary | ICD-10-CM

## 2016-02-03 DIAGNOSIS — H409 Unspecified glaucoma: Secondary | ICD-10-CM

## 2016-02-03 DIAGNOSIS — E119 Type 2 diabetes mellitus without complications: Secondary | ICD-10-CM | POA: Diagnosis not present

## 2016-02-03 DIAGNOSIS — K219 Gastro-esophageal reflux disease without esophagitis: Secondary | ICD-10-CM | POA: Diagnosis not present

## 2016-02-03 DIAGNOSIS — Z0001 Encounter for general adult medical examination with abnormal findings: Secondary | ICD-10-CM | POA: Diagnosis not present

## 2016-02-03 DIAGNOSIS — E559 Vitamin D deficiency, unspecified: Secondary | ICD-10-CM

## 2016-02-03 LAB — HEPATIC FUNCTION PANEL
ALT: 11 U/L (ref 6–29)
AST: 15 U/L (ref 10–35)
Albumin: 4.1 g/dL (ref 3.6–5.1)
Alkaline Phosphatase: 68 U/L (ref 33–130)
BILIRUBIN INDIRECT: 0.5 mg/dL (ref 0.2–1.2)
Bilirubin, Direct: 0.1 mg/dL (ref <=0.2)
TOTAL PROTEIN: 7.2 g/dL (ref 6.1–8.1)
Total Bilirubin: 0.6 mg/dL (ref 0.2–1.2)

## 2016-02-03 LAB — BASIC METABOLIC PANEL WITH GFR
BUN: 15 mg/dL (ref 7–25)
CALCIUM: 9.4 mg/dL (ref 8.6–10.4)
CO2: 28 mmol/L (ref 20–31)
Chloride: 104 mmol/L (ref 98–110)
Creat: 0.68 mg/dL (ref 0.60–0.93)
GFR, EST NON AFRICAN AMERICAN: 86 mL/min (ref >=60)
GFR, Est African American: >89 mL/min (ref >=60)
GLUCOSE: 96 mg/dL (ref 65–99)
Potassium: 4.3 mmol/L (ref 3.5–5.3)
Sodium: 142 mmol/L (ref 135–146)

## 2016-02-03 LAB — CBC WITH DIFFERENTIAL/PLATELET
BASOS PCT: 1 %
Basophils Absolute: 62 cells/uL (ref 0–200)
EOS ABS: 62 {cells}/uL (ref 15–500)
Eosinophils Relative: 1 %
HEMATOCRIT: 39.2 % (ref 35.0–45.0)
HEMOGLOBIN: 13 g/dL (ref 11.7–15.5)
LYMPHS PCT: 25 %
Lymphs Abs: 1550 cells/uL (ref 850–3900)
MCH: 30 pg (ref 27.0–33.0)
MCHC: 33.2 g/dL (ref 32.0–36.0)
MCV: 90.3 fL (ref 80.0–100.0)
MONO ABS: 496 {cells}/uL (ref 200–950)
MPV: 9.9 fL (ref 7.5–12.5)
Monocytes Relative: 8 %
NEUTROS ABS: 4030 {cells}/uL (ref 1500–7800)
Neutrophils Relative %: 65 %
Platelets: 272 10*3/uL (ref 140–400)
RBC: 4.34 MIL/uL (ref 3.80–5.10)
RDW: 13.6 % (ref 11.0–15.0)
WBC: 6.2 10*3/uL (ref 3.8–10.8)

## 2016-02-03 LAB — LIPID PANEL
CHOLESTEROL: 173 mg/dL (ref 125–200)
HDL: 64 mg/dL (ref >=46)
LDL Cholesterol: 91 mg/dL (ref <130)
Total CHOL/HDL Ratio: 2.7 Ratio (ref <=5.0)
Triglycerides: 88 mg/dL (ref <150)
VLDL: 18 mg/dL (ref <30)

## 2016-02-03 LAB — MAGNESIUM: MAGNESIUM: 2 mg/dL (ref 1.5–2.5)

## 2016-02-03 LAB — TSH: TSH: 0.84 m[IU]/L

## 2016-02-03 NOTE — Patient Instructions (Signed)
Memory Compensation Strategies  1. Use "WARM" strategy.  W= write it down  A= associate it  R= repeat it  M= make a mental note  2.   You can keep a Glass blower/designerMemory Notebook.  Use a 3-ring notebook with sections for the following: calendar, important names and phone numbers,  medications, doctors' names/phone numbers, lists/reminders, and a section to journal what you did  each day.   3.    Use a calendar to write appointments down.  4.    Write yourself a schedule for the day.  This can be placed on the calendar or in a separate section of the Memory Notebook.  Keeping a  regular schedule can help memory.  5.    Use medication organizer with sections for each day or morning/evening pills.  You may need help loading it  6.    Keep a basket, or pegboard by the door.  Place items that you need to take out with you in the basket or on the pegboard.  You may also want to  include a message board for reminders.  7.    Use sticky notes.  Place sticky notes with reminders in a place where the task is performed.  For example: " turn off the  stove" placed by the stove, "lock the door" placed on the door at eye level, " take your medications" on  the bathroom mirror or by the place where you normally take your medications.  8.    Use alarms/timers.  Use while cooking to remind yourself to check on food or as a reminder to take your medicine, or as a  reminder to make a call, or as a reminder to perform another task, etc.   Osteoporosis Osteoporosis is the thinning and loss of density in the bones. Osteoporosis makes the bones more brittle, fragile, and likely to break (fracture). Over time, osteoporosis can cause the bones to become so weak that they fracture after a simple fall. The bones most likely to fracture are the bones in the hip, wrist, and spine. CAUSES  The exact cause is not known. RISK FACTORS Anyone can develop osteoporosis. You may be at greater risk if you have a family history of the  condition or have poor nutrition. You may also have a higher risk if you are:   Female.   74 years old or older.  A smoker.  Not physically active.   White or Asian.  Slender. SIGNS AND SYMPTOMS  A fracture might be the first sign of the disease, especially if it results from a fall or injury that would not usually cause a bone to break. Other signs and symptoms include:  2. Low back and neck pain. 3. Stooped posture. 4. Height loss. DIAGNOSIS  To make a diagnosis, your health care provider may:  Take a medical history.  Perform a physical exam.  Order tests, such as:  A bone mineral density test.  A dual-energy X-ray absorptiometry test. TREATMENT  The goal of osteoporosis treatment is to strengthen your bones to reduce your risk of a fracture. Treatment may involve:  Making lifestyle changes, such as:  Eating a diet rich in calcium.  Doing weight-bearing and muscle-strengthening exercises.  Stopping tobacco use.  Limiting alcohol intake.  Taking medicine to slow the process of bone loss or to increase bone density.  Monitoring your levels of calcium and vitamin D. HOME CARE INSTRUCTIONS  Include calcium and vitamin D in your diet. Calcium is important for  bone health, and vitamin D helps the body absorb calcium.  Perform weight-bearing and muscle-strengthening exercises as directed by your health care provider.  Do not use any tobacco products, including cigarettes, chewing tobacco, and electronic cigarettes. If you need help quitting, ask your health care provider.  Limit your alcohol intake.  Take medicines only as directed by your health care provider.  Keep all follow-up visits as directed by your health care provider. This is important.  Take precautions at home to lower your risk of falling, such as:  Keeping rooms well lit and clutter free.  Installing safety rails on stairs.  Using rubber mats in the bathroom and other areas that are often  wet or slippery. SEEK IMMEDIATE MEDICAL CARE IF:  You fall or injure yourself.    This information is not intended to replace advice given to you by your health care provider. Make sure you discuss any questions you have with your health care provider.   Document Released: 04/12/2005 Document Revised: 07/24/2014 Document Reviewed: 12/11/2013 [employer] Interactive Patient Education [employer]

## 2016-02-03 NOTE — Progress Notes (Signed)
MEDICARE ANNUAL WELLNESS VISIT AND FOLLOW UP  Assessment:   1. Hypertension Some hypotension with history of two falls which she states were from tripping, will still cut atenolol in 1/2 and have her monitor BP - CBC with Differential - BASIC METABOLIC PANEL WITH GFR - Hepatic function panel - TSH  2. GERD (gastroesophageal reflux disease) -controlled  3. T2 NIDDM Discussed general issues about diabetes pathophysiology and management., Educational material distributed., Suggested low cholesterol diet., Encouraged aerobic exercise., Discussed foot care., Reminded to get yearly retinal exam. - Hemoglobin A1c - Insulin, fasting - HM DIABETES FOOT EXAM  4. Hyperlipidemia - Lipid panel  5. Vitamin D deficiency - Vit D  25 hydroxy (rtn osteoporosis monitoring)  6. Depression -remission  7. Encounter for long-term (current) use of other medications - Magnesium  8. Osteopenia Due this year   Plan:   During the course of the visit the patient was educated and counseled about appropriate screening and preventive services including:    Pneumococcal vaccine   Influenza vaccine  Td vaccine  Screening electrocardiogram  Screening mammography  Bone densitometry screening  Colorectal cancer screening  Diabetes screening  Glaucoma screening  Nutrition counseling   Advanced directives: given info/requested   Subjective:   Melissa Davis is a 75 y.o. female who presents for Medicare Annual Wellness Visit and 3 month follow up on hypertension, prediabetes, hyperlipidemia, vitamin D def.   Her blood pressure has been controlled at home, she is on 1/2 of the atenolol due to low BP and doing well on this, today their BP is BP: 122/64 mmHg She does workout, she is no longer keeping her 34 year old grand son and her grand kids just moved out 1 block down the street which decreases some stress.  She denies chest pain, shortness of breath, dizziness.  She is not on cholesterol  medication and denies myalgias. Her cholesterol is at goal. The cholesterol last visit was:   Lab Results  Component Value Date   CHOL 162 10/28/2015   HDL 54 10/28/2015   LDLCALC 86 10/28/2015   TRIG 111 10/28/2015   CHOLHDL 3.0 10/28/2015   She has been working on diet and exercise for diabetes without complications, she is on metformin, she is on ACE, she is on bASA, and denies paresthesia of the feet, polydipsia and polyuria. Last A1C in the office was:  Lab Results  Component Value Date   HGBA1C 6.2* 10/28/2015   Lab Results  Component Value Date   GFRNONAA 82 10/28/2015   Patient is on Vitamin D supplement, 1000 IU but occ misses some. Lab Results  Component Value Date   VD25OH 15* 07/29/2015   She has glaucoma and folllows very closely with her eye doctor.    Medication Review Current Outpatient Prescriptions on File Prior to Visit  Medication Sig Dispense Refill  . amLODipine (NORVASC) 10 MG tablet TAKE 1 TABLET BY MOUTH EVERY DAY 90 tablet 1  . atenolol (TENORMIN) 100 MG tablet TAKE 1 TABLET BY MOUTH EVERY DAY 90 tablet 1  . Blood Glucose Monitoring Suppl (ONE TOUCH ULTRA SYSTEM KIT) W/DEVICE KIT 1 kit by Does not apply route once. Check glucose 1 time daily-DX-E11.2 1 each 0  . clorazepate (TRANXENE-T) 7.5 MG tablet Take 1 tablet (7.5 mg total) by mouth 2 (two) times daily as needed for anxiety. 60 tablet 0  . glucose blood (ONE TOUCH ULTRA TEST) test strip Check glucose 1 time daily-DX-E11.2 100 each PRN  . Lancets [facility]  ULTRASOFT) lancets Check glucose 1 time daily-DX-E11.2 100 each PRN  . metFORMIN (GLUCOPHAGE-XR) 500 MG 24 hr tablet TAKE 4 TABLETS BY MOUTH DAILY OR AS DIRECTED FOR DIABETES 360 tablet 1  . quinapril (ACCUPRIL) 40 MG tablet TAKE 1 TABLET BY MOUTH EVERY DAY 90 tablet 0  . TRAVATAN Z 0.004 % SOLN ophthalmic solution Place 1 drop into both eyes at bedtime.      No current facility-administered medications on file prior to visit.    Current  Problems (verified) Patient Active Problem List   Diagnosis Date Noted  . Body mass index (BMI) of 20.0-20.9 in adult 04/22/2015  . Osteopenia 07/03/2014  . Medication management 09/26/2013  . Glaucoma 09/26/2013  . T2_NIDDM   . Hyperlipidemia   . Hypertension   . Vitamin D deficiency   . GERD (gastroesophageal reflux disease)   . Depression, major, in remission ([place])     Screening Tests Immunization History  Administered Date(s) Administered  . Influenza Split 05/01/2013  . Influenza, High Dose Seasonal PF 03/31/2014, 04/22/2015  . Pneumococcal Conjugate-13 07/03/2014  . Pneumococcal Polysaccharide-23 08/05/1998  . Td 06/17/2012    Preventative care: Tetanus: 2013  Pneumovax: 2000  Prevnar 13: 2015 Flu vaccine: 2016 Zostavax:  Cost 95 dollars, she will check with [facility] on price first  Pap: 2010 neg declines another MGM: 02/2015 CATEGORY D DEXA: 12/2013 Osteopenia due 2017 Colonoscopy: 2011 due 10 years  EGD: N/A DEE: 04/2013 CXR 10/2013 Cath: 2010 normal EF 60% CT cervical spine 07/2013 CT head 07/2013  Names of Other Physician/Practitioners you currently use: 1. [facility]- here for primary care 2. Dr. Bennye Alm, dentist, last visit June 2017, right eye cat surgery Patient Care Team: Unk Pinto, MD as PCP - General (Internal Medicine) Rob Hickman, MD as Consulting Physician (Ophthalmology) June 2017 Lafayette Dragon, MD as Consulting Physician (Gastroenterology) Kendell Bane, MD as Consulting Physician (Podiatry)  Allergies Allergies  Allergen Reactions  . Buspirone   . Clonidine Derivatives   . Cymbalta [Duloxetine Hcl]   . Elavil [Amitriptyline]   . Hytrin [Terazosin]   . Nizofenone   . Paxil [Paroxetine Hcl]   . Prozac [Fluoxetine Hcl]   . Zoloft [Sertraline Hcl]    SURGICAL HISTORY She  has past surgical history that includes Cholecystectomy and Total abdominal hysterectomy w/ bilateral  salpingoophorectomy (1990). FAMILY HISTORY Her family history includes Alzheimer's disease in her father; CVA in her father; Heart attack in her mother; Hypertension in her father and mother; Ulcers in her mother. SOCIAL HISTORY She  reports that she has never smoked. She does not have any smokeless tobacco history on file. She reports that she does not drink alcohol or use illicit drugs.  MEDICARE WELLNESS OBJECTIVES: Tobacco use: She does not smoke.  Patient is not a former smoker. Alcohol Current alcohol use: none Caffeine Current caffeine use: coffee 1-2 /day Osteoporosis: postmenopausal estrogen deficiency and dietary calcium and/or vitamin D deficiency, History of fracture in the past year: no, had fractures with fall in the past Diet: in general, a "healthy" diet   Physical activity: walking and watches grandkids Depression/mood screen:   Depression screen [facility] 2/9 02/03/2016  Decreased Interest 0  Down, Depressed, Hopeless 0  PHQ - 2 Score 0   Hearing: normal Visual acuity: normal,  does perform annual eye exam  ADLs:  In your present state of health, do you have any difficulty performing the following activities: 02/03/2016 04/22/2015  Hearing? N N  Vision? N  N  Difficulty concentrating or making decisions? Y N  Walking or climbing stairs? N N  Dressing or bathing? N N  Doing errands, shopping? N N  Preparing Food and eating ? N -  Using the Toilet? N -  In the past six months, have you accidently leaked urine? N -  Do you have problems with loss of bowel control? N -  Managing your Medications? N -  Managing your Finances? N -  Housekeeping or managing your Housekeeping? N -    Fall risk: low-moderate risk Cognitive Testing  Alert? Yes  Normal Appearance?Yes  Oriented to person? Yes  Place? Yes   Time? Yes  Recall of three objects?  Yes  Can perform simple calculations? Yes  Displays appropriate judgment?Yes  Can read the correct time from a watch face?Yes EOL  planning: Does patient have an advance directive?: No Would patient like information on creating an advanced directive?: Yes - Educational materials given     Objective:   Blood pressure 122/64, pulse 52, temperature 97.9 F (36.6 C), temperature source Temporal, resp. rate 14, height 5' 3.5" (1.613 m), weight 117 lb (53.071 kg), SpO2 98 %. Body mass index is 20.4 kg/(m^2).  General appearance: alert, no distress, WD/WN,  female HEENT: normocephalic, sclerae anicteric, TMs pearly, nares patent, no discharge or erythema, pharynx normal Oral cavity: MMM, no lesions Neck: supple, no lymphadenopathy, no thyromegaly, no masses Heart: RRR, normal S1, S2, no murmurs Lungs: CTA bilaterally, no wheezes, rhonchi, or rales Abdomen: +bs, soft, non tender, non distended, no masses, no hepatomegaly, no splenomegaly Musculoskeletal: nontender, no swelling, no obvious deformity, mild cervical kyphosis Extremities: no edema, no cyanosis, no clubbing Pulses: 2+ symmetric, upper and lower extremities, normal cap refill Neurological: alert, oriented x 3, CN2-12 intact, strength normal upper extremities and lower extremities, sensation normal throughout, DTRs 2+ throughout, no cerebellar signs, gait antalgic with cane Psychiatric: normal affect, behavior normal, pleasant  Breast: defer Gyn: defer Rectal: defer  Medicare Attestation I have personally reviewed: The patient's medical and social history Their use of alcohol, tobacco or illicit drugs Their current medications and supplements The patient's functional ability including ADLs,fall risks, home safety risks, cognitive, and hearing and visual impairment Diet and physical activities Evidence for depression or mood disorders  The patient's weight, height, BMI, and visual acuity have been recorded in the chart.  I have made referrals, counseling, and provided education to the patient based on review of the above and I have provided the patient with  a written personalized care plan for preventive services.     Vicie Mutters, PA-C   02/03/2016

## 2016-02-04 LAB — HEMOGLOBIN A1C
Hgb A1c MFr Bld: 6.2 % — ABNORMAL HIGH (ref <5.7)
MEAN PLASMA GLUCOSE: 131 mg/dL

## 2016-02-04 LAB — VITAMIN D 25 HYDROXY (VIT D DEFICIENCY, FRACTURES): Vit D, 25-Hydroxy: 18 ng/mL — ABNORMAL LOW (ref 30–100)

## 2016-02-09 ENCOUNTER — Other Ambulatory Visit: Payer: Self-pay | Admitting: Physician Assistant

## 2016-02-09 DIAGNOSIS — Z1231 Encounter for screening mammogram for malignant neoplasm of breast: Secondary | ICD-10-CM

## 2016-02-15 ENCOUNTER — Other Ambulatory Visit: Payer: Self-pay | Admitting: *Deleted

## 2016-02-15 MED ORDER — GLUCOSE BLOOD VI STRP: ORAL_STRIP | 3 refills | Status: AC

## 2016-02-29 ENCOUNTER — Other Ambulatory Visit: Payer: Self-pay | Admitting: Internal Medicine

## 2016-03-17 ENCOUNTER — Ambulatory Visit
Admission: RE | Admit: 2016-03-17 | Discharge: 2016-03-17 | Disposition: A | Discharge status: Home / Self Care | Payer: Medicare Other | Source: Ambulatory Visit | Attending: Physician Assistant | Admitting: Physician Assistant

## 2016-03-17 DIAGNOSIS — M8580 Other specified disorders of bone density and structure, unspecified site: Secondary | ICD-10-CM

## 2016-03-17 DIAGNOSIS — Z1231 Encounter for screening mammogram for malignant neoplasm of breast: Secondary | ICD-10-CM

## 2016-03-29 ENCOUNTER — Encounter: Payer: Self-pay | Admitting: Podiatry

## 2016-03-29 ENCOUNTER — Ambulatory Visit (INDEPENDENT_AMBULATORY_CARE_PROVIDER_SITE_OTHER): Payer: Medicare Other | Admitting: Podiatry

## 2016-03-29 VITALS — BP 146/67 | HR 58 | Resp 18

## 2016-03-29 DIAGNOSIS — B351 Tinea unguium: Secondary | ICD-10-CM

## 2016-03-29 DIAGNOSIS — M79676 Pain in unspecified toe(s): Secondary | ICD-10-CM

## 2016-03-29 NOTE — Patient Instructions (Signed)
Diabetes and Foot Care Diabetes may cause you to have problems because of poor blood supply (circulation) to your feet and legs. This may cause the skin on your feet to become thinner, break easier, and heal more slowly. Your skin may become dry, and the skin may peel and crack. You may also have nerve damage in your legs and feet causing decreased feeling in them. You may not notice minor injuries to your feet that could lead to infections or more serious problems. Taking care of your feet is one of the most important things you can do for yourself.  HOME CARE INSTRUCTIONS  Wear shoes at all times, even in the house. Do not go barefoot. Bare feet are easily injured.  Check your feet daily for blisters, cuts, and redness. If you cannot see the bottom of your feet, use a mirror or ask someone for help.  Wash your feet with warm water (do not use hot water) and mild soap. Then pat your feet and the areas between your toes until they are completely dry. Do not soak your feet as this can dry your skin.  Apply a moisturizing lotion or petroleum jelly (that does not contain alcohol and is unscented) to the skin on your feet and to dry, brittle toenails. Do not apply lotion between your toes.  Trim your toenails straight across. Do not dig under them or around the cuticle. File the edges of your nails with an emery board or nail file.  Do not cut corns or calluses or try to remove them with medicine.  Wear clean socks or stockings every day. Make sure they are not too tight. Do not wear knee-high stockings since they may decrease blood flow to your legs.  Wear shoes that fit properly and have enough cushioning. To break in new shoes, wear them for just a few hours a day. This prevents you from injuring your feet. Always look in your shoes before you put them on to be sure there are no objects inside.  Do not cross your legs. This may decrease the blood flow to your feet.  If you find a minor scrape,  cut, or break in the skin on your feet, keep it and the skin around it clean and dry. These areas may be cleansed with mild soap and water. Do not cleanse the area with peroxide, alcohol, or iodine.  When you remove an adhesive bandage, be sure not to damage the skin around it.  If you have a wound, look at it several times a day to make sure it is healing.  Do not use heating pads or hot water bottles. They may burn your skin. If you have lost feeling in your feet or legs, you may not know it is happening until it is too late.  Make sure your health care provider performs a complete foot exam at least annually or more often if you have foot problems. Report any cuts, sores, or bruises to your health care provider immediately. SEEK MEDICAL CARE IF:   You have an injury that is not healing.  You have cuts or breaks in the skin.  You have an ingrown nail.  You notice redness on your legs or feet.  You feel burning or tingling in your legs or feet.  You have pain or cramps in your legs and feet.  Your legs or feet are numb.  Your feet always feel cold. SEEK IMMEDIATE MEDICAL CARE IF:   There is increasing redness,   swelling, or pain in or around a wound.  There is a red line that goes up your leg.  Pus is coming from a wound.  You develop a fever or as directed by your health care provider.  You notice a bad smell coming from an ulcer or wound.   This information is not intended to replace advice given to you by your health care provider. Make sure you discuss any questions you have with your health care provider.   Document Released: 06/30/2000 Document Revised: 03/05/2013 Document Reviewed: 12/10/2012 [employer] Interactive Patient Education 2016 [employer]  

## 2016-03-29 NOTE — Progress Notes (Signed)
Patient ID: Melissa Davis, female   DOB: August 28, 1940, 75 y.o.   MRN: [id number]    Subjective: This patient presents today for scheduled visit complaining of painful toenails walking wearing shoes and request toenail debridement  Objective: Orientated 3 DP and PT pulses 2/4 bilaterally Capillary reflex immediate bilaterally Sensation to 10 g monofilament wire intact 4/5 bilaterally Vibratory sensation reactive bilaterally Ankle reflex equal and reactive bilaterally HAV left Hammertoe second left No open skin lesions bilaterally The toenails are brittle, elongated, discolored, incurvated, hypertrophic and tender to direct palpation 6-10  Assessment: Symptomatic onychomycoses 6-10 Type II diabetic without complications  Plan: Debridement of toenails mechanically and electrically 10 without any bleeding  Reappoint 3 months

## 2016-04-18 ENCOUNTER — Other Ambulatory Visit: Payer: Self-pay | Admitting: *Deleted

## 2016-04-18 ENCOUNTER — Other Ambulatory Visit: Payer: Self-pay | Admitting: Internal Medicine

## 2016-04-18 DIAGNOSIS — I10 Essential (primary) hypertension: Secondary | ICD-10-CM

## 2016-04-18 MED ORDER — BISOPROLOL-HYDROCHLOROTHIAZIDE 10-6.25 MG PO TABS: ORAL_TABLET | ORAL | 1 refills | Status: DC

## 2016-04-18 MED ORDER — METOPROLOL SUCCINATE ER 100 MG PO TB24: 100.0000 mg | ORAL_TABLET | Freq: Every day | ORAL | 1 refills | Status: DC

## 2016-05-05 ENCOUNTER — Ambulatory Visit (INDEPENDENT_AMBULATORY_CARE_PROVIDER_SITE_OTHER): Payer: Medicare Other | Admitting: Internal Medicine

## 2016-05-05 VITALS — BP 144/78 | HR 88 | Temp 97.7°F | Resp 16 | Ht 63.0 in | Wt 113.8 lb

## 2016-05-05 DIAGNOSIS — E119 Type 2 diabetes mellitus without complications: Secondary | ICD-10-CM | POA: Diagnosis not present

## 2016-05-05 DIAGNOSIS — Z23 Encounter for immunization: Secondary | ICD-10-CM | POA: Diagnosis not present

## 2016-05-05 DIAGNOSIS — Z79899 Other long term (current) drug therapy: Secondary | ICD-10-CM

## 2016-05-05 DIAGNOSIS — E559 Vitamin D deficiency, unspecified: Secondary | ICD-10-CM

## 2016-05-05 DIAGNOSIS — I10 Essential (primary) hypertension: Secondary | ICD-10-CM | POA: Diagnosis not present

## 2016-05-05 DIAGNOSIS — E782 Mixed hyperlipidemia: Secondary | ICD-10-CM | POA: Diagnosis not present

## 2016-05-05 LAB — BASIC METABOLIC PANEL WITH GFR
BUN: 17 mg/dL (ref 7–25)
CALCIUM: 9.6 mg/dL (ref 8.6–10.4)
CO2: 28 mmol/L (ref 20–31)
CREATININE: 0.75 mg/dL (ref 0.60–0.93)
Chloride: 101 mmol/L (ref 98–110)
GFR, Est African American: >89 mL/min (ref >=60)
GFR, Est Non African American: 79 mL/min (ref >=60)
GLUCOSE: 88 mg/dL (ref 65–99)
Potassium: 4.2 mmol/L (ref 3.5–5.3)
SODIUM: 142 mmol/L (ref 135–146)

## 2016-05-05 LAB — CBC WITH DIFFERENTIAL/PLATELET
BASOS ABS: 66 {cells}/uL (ref 0–200)
Basophils Relative: 1 %
EOS PCT: 1 %
Eosinophils Absolute: 66 cells/uL (ref 15–500)
HCT: 42.1 % (ref 35.0–45.0)
HEMOGLOBIN: 13.9 g/dL (ref 11.7–15.5)
LYMPHS ABS: 1782 {cells}/uL (ref 850–3900)
Lymphocytes Relative: 27 %
MCH: 30.3 pg (ref 27.0–33.0)
MCHC: 33 g/dL (ref 32.0–36.0)
MCV: 91.9 fL (ref 80.0–100.0)
MPV: 9.2 fL (ref 7.5–12.5)
Monocytes Absolute: 462 cells/uL (ref 200–950)
Monocytes Relative: 7 %
NEUTROS PCT: 64 %
Neutro Abs: 4224 cells/uL (ref 1500–7800)
Platelets: 279 10*3/uL (ref 140–400)
RBC: 4.58 MIL/uL (ref 3.80–5.10)
RDW: 13 % (ref 11.0–15.0)
WBC: 6.6 10*3/uL (ref 3.8–10.8)

## 2016-05-05 LAB — HEPATIC FUNCTION PANEL
ALBUMIN: 4.3 g/dL (ref 3.6–5.1)
ALT: 12 U/L (ref 6–29)
AST: 21 U/L (ref 10–35)
Alkaline Phosphatase: 74 U/L (ref 33–130)
BILIRUBIN DIRECT: 0.2 mg/dL (ref <=0.2)
Indirect Bilirubin: 0.5 mg/dL (ref 0.2–1.2)
TOTAL PROTEIN: 7.4 g/dL (ref 6.1–8.1)
Total Bilirubin: 0.7 mg/dL (ref 0.2–1.2)

## 2016-05-05 LAB — LIPID PANEL
Cholesterol: 181 mg/dL (ref 125–200)
HDL: 71 mg/dL (ref >=46)
LDL Cholesterol: 96 mg/dL (ref <130)
Total CHOL/HDL Ratio: 2.5 Ratio (ref <=5.0)
Triglycerides: 68 mg/dL (ref <150)
VLDL: 14 mg/dL (ref <30)

## 2016-05-05 LAB — HEMOGLOBIN A1C
HEMOGLOBIN A1C: 5.9 % — AB (ref <5.7)
MEAN PLASMA GLUCOSE: 123 mg/dL

## 2016-05-05 LAB — TSH: TSH: 0.79 mIU/L

## 2016-05-05 LAB — MAGNESIUM: MAGNESIUM: 2 mg/dL (ref 1.5–2.5)

## 2016-05-05 NOTE — Patient Instructions (Signed)

## 2016-05-05 NOTE — Progress Notes (Signed)
[facility] Unk Pinto, M.D.        Uvaldo Bristle. Silverio Lay, P.A.-C       Starlyn Skeans, P.A.-C  [facility]                [address] [place] [address] Telephone [phone] Telefax [phone] ______________________________________________________________________     This very nice 75 y.o. WWF presents for quarterly  follow up with Hypertension, Hyperlipidemia, Pre-Diabetes and Vitamin D Deficiency.      Patient is treated for HTN (1994) & BP has been controlled at home. Today's BP is 144/78. Patient has had no complaints of any cardiac type chest pain, palpitations, dyspnea/orthopnea/PND, dizziness, claudication, or dependent edema.     Hyperlipidemia is controlled with diet & meds. Patient denies myalgias or other med SE's. Last Lipids were at goal: Lab Results  Component Value Date   CHOL 181 05/05/2016   HDL 71 05/05/2016   LDLCALC 96 05/05/2016   TRIG 68 05/05/2016   CHOLHDL 2.5 05/05/2016      Also, the patient has history of T2_NIDDM (2004) and has had no symptoms of reactive hypoglycemia, diabetic polys, paresthesias or visual blurring.  Last A1c was not at goal : Lab Results  Component Value Date   HGBA1C 5.9 (H) 05/05/2016      Further, the patient also has history of Vitamin D Deficiency of "21" in July 2016 and does not supplement supplements vitamin D and last vitamin D was extremely low :  Lab Results  Component Value Date   VD25OH 22 (L) 05/05/2016   Current Outpatient Prescriptions on File Prior to Visit  Medication Sig  . Blood Glucose Monitoring Suppl (ONE TOUCH ULTRA SYSTEM KIT) W/DEVICE KIT 1 kit by Does not apply route once. Check glucose 1 time daily-DX-E11.2  . clorazepate (TRANXENE-T) 7.5 MG tablet Take 1 tablet (7.5 mg total) by mouth 2 (two) times daily as needed for anxiety.  Marland Kitchen glucose blood (ONE TOUCH ULTRA TEST) test strip Check glucose 1 time  daily-DX-E11.2  . Lancets (ONETOUCH ULTRASOFT) lancets Check glucose 1 time daily-DX-E11.2  . metFORMIN (GLUCOPHAGE-XR) 500 MG 24 hr tablet TAKE 4 TABLETS BY MOUTH DAILY OR AS DIRECTED FOR DIABETES  . metoprolol succinate (TOPROL-XL) 100 MG 24 hr tablet Take 1 tablet (100 mg total) by mouth daily. Take with or immediately following a meal.  . TRAVATAN Z 0.004 % SOLN ophthalmic solution Place 1 drop into both eyes at bedtime.    No current facility-administered medications on file prior to visit.    Allergies  Allergen Reactions  . Buspirone   . Clonidine Derivatives   . Cymbalta [Duloxetine Hcl]   . Elavil [Amitriptyline]   . Hytrin [Terazosin]   . Nizofenone   . Paxil [Paroxetine Hcl]   . Prozac [Fluoxetine Hcl]   . Zoloft [Sertraline Hcl]    PMHx:   Past Medical History:  Diagnosis Date  . Depression   . GERD (gastroesophageal reflux disease)   . Hyperlipidemia   . Hypertension   . Type II or unspecified type diabetes mellitus without mention of complication, not stated as uncontrolled   . Vitamin D deficiency    Immunization History  Administered Date(s) Administered  . Influenza Split 05/01/2013  . Influenza, High Dose Seasonal PF 03/31/2014, 04/22/2015  . Pneumococcal Conjugate-13 07/03/2014  . Pneumococcal Polysaccharide-23 08/05/1998  . Td 06/17/2012  Past Surgical History:  Procedure Laterality Date  . CHOLECYSTECTOMY    . TOTAL ABDOMINAL HYSTERECTOMY W/ BILATERAL SALPINGOOPHORECTOMY  1990   FHx:    Reviewed / unchanged  SHx:    Reviewed / unchanged  Systems Review:  Constitutional: Denies fever, chills, wt changes, headaches, insomnia, fatigue, night sweats, change in appetite. Eyes: Denies redness, blurred vision, diplopia, discharge, itchy, watery eyes.  ENT: Denies discharge, congestion, post nasal drip, epistaxis, sore throat, earache, hearing loss, dental pain, tinnitus, vertigo, sinus pain, snoring.  CV: Denies chest pain, palpitations, irregular  heartbeat, syncope, dyspnea, diaphoresis, orthopnea, PND, claudication or edema. Respiratory: denies cough, dyspnea, DOE, pleurisy, hoarseness, laryngitis, wheezing.  Gastrointestinal: Denies dysphagia, odynophagia, heartburn, reflux, water brash, abdominal pain or cramps, nausea, vomiting, bloating, diarrhea, constipation, hematemesis, melena, hematochezia  or hemorrhoids. Genitourinary: Denies dysuria, frequency, urgency, nocturia, hesitancy, discharge, hematuria or flank pain. Musculoskeletal: Denies arthralgias, myalgias, stiffness, jt. swelling, pain, limping or strain/sprain.  Skin: Denies pruritus, rash, hives, warts, acne, eczema or change in skin lesion(s). Neuro: No weakness, tremor, incoordination, spasms, paresthesia or pain. Psychiatric: Denies confusion, memory loss or sensory loss. Endo: Denies change in weight, skin or hair change.  Heme/Lymph: No excessive bleeding, bruising or enlarged lymph nodes.  Physical Exam BP (!) 144/78   Pulse 88   Temp 97.7 F (36.5 C)   Resp 16   Ht 5' 3" (1.6 m)   Wt 113 lb 12.8 oz (51.6 kg)   BMI 20.16 kg/m   Appears well nourished and in no distress.  Eyes: PERRLA, EOMs, conjunctiva no swelling or erythema. Sinuses: No frontal/maxillary tenderness ENT/Mouth: EAC's clear, TM's nl w/o erythema, bulging. Nares clear w/o erythema, swelling, exudates. Oropharynx clear without erythema or exudates. Oral hygiene is good. Tongue normal, non obstructing. Hearing intact.  Neck: Supple. Thyroid nl. Car 2+/2+ without bruits, nodes or JVD. Chest: Respirations nl with BS clear & equal w/o rales, rhonchi, wheezing or stridor.  Cor: Heart sounds normal w/ regular rate and rhythm without sig. murmurs, gallops, clicks, or rubs. Peripheral pulses normal and equal  without edema.  Abdomen: Soft & bowel sounds normal. Non-tender w/o guarding, rebound, hernias, masses, or organomegaly.  Lymphatics: Unremarkable.  Musculoskeletal: Full ROM all peripheral  extremities, joint stability, 5/5 strength, and normal gait.  Skin: Warm, dry without exposed rashes, lesions or ecchymosis apparent.  Neuro: Cranial nerves intact, reflexes equal bilaterally. Sensory-motor testing grossly intact. Tendon reflexes grossly intact.  Pysch: Alert & oriented x 3.  Insight and judgement nl & appropriate. No ideations.  Assessment and Plan:   1. Essential hypertension  - Continue medication, monitor blood pressure at home. Continue DASH diet. Reminder to go to the ER if any CP, SOB, nausea, dizziness, severe HA, changes vision/speech, left arm numbness and tingling and jaw pain. - TSH - amLODipine (NORVASC) 10 MG tablet; Take 1 tablet (10 mg total) by mouth daily.  Dispense: 90 tablet; Refill: 1 - quinapril (ACCUPRIL) 40 MG tablet; Take 1 tablet (40 mg total) by mouth daily.  Dispense: 90 tablet; Refill: 0 - d/c metoprolol and change to Ziac 5/6.25 qd 2. Mixed hyperlipidemia  - Continue diet/meds, exercise,& lifestyle modifications. Continue monitor periodic cholesterol/liver & renal functions - Lipid panel - TSH  3. T2_NIDDM  - Continue diet, exercise, lifestyle modifications. Monitor appropriate labs. - Hemoglobin A1c - Insulin, random  4. Vitamin D deficiency  - Continue supplementation. - VITAMIN D 25 Hydroxy   5. Medication management  - CBC with Differential/Platelet - BASIC METABOLIC PANEL WITH  GFR - Hepatic function panel - Magnesium      Recommended regular exercise, BP monitoring, weight control, and discussed med and SE's. Recommended labs to assess and monitor clinical status. Further disposition pending results of labs. Over 30 minutes of exam, counseling, chart review was performed

## 2016-05-06 LAB — INSULIN, RANDOM: INSULIN: 4 u[IU]/mL (ref 2.0–19.6)

## 2016-05-06 LAB — VITAMIN D 25 HYDROXY (VIT D DEFICIENCY, FRACTURES): VIT D 25 HYDROXY: 22 ng/mL — AB (ref 30–100)

## 2016-05-07 ENCOUNTER — Encounter: Payer: Self-pay | Admitting: Internal Medicine

## 2016-05-07 MED ORDER — BISOPROLOL-HYDROCHLOROTHIAZIDE 5-6.25 MG PO TABS: 1.0000 | ORAL_TABLET | Freq: Every day | ORAL | 1 refills | Status: DC

## 2016-05-07 MED ORDER — QUINAPRIL HCL 40 MG PO TABS: 40.0000 mg | ORAL_TABLET | Freq: Every day | ORAL | 0 refills | Status: DC

## 2016-05-07 MED ORDER — AMLODIPINE BESYLATE 10 MG PO TABS: 10.0000 mg | ORAL_TABLET | Freq: Every day | ORAL | 1 refills | Status: DC

## 2016-05-08 DIAGNOSIS — Z23 Encounter for immunization: Secondary | ICD-10-CM | POA: Diagnosis not present

## 2016-05-08 NOTE — Addendum Note (Signed)
Addended by: Valrie HartEVANS, Arsenia Goracke C on: 05/08/2016 12:48 PM   Modules accepted: Orders

## 2016-06-02 ENCOUNTER — Other Ambulatory Visit: Payer: Self-pay | Admitting: Physician Assistant

## 2016-06-02 MED ORDER — BISOPROLOL FUMARATE 5 MG PO TABS: 5.0000 mg | ORAL_TABLET | Freq: Every day | ORAL | 3 refills | Status: DC

## 2016-06-28 ENCOUNTER — Ambulatory Visit (INDEPENDENT_AMBULATORY_CARE_PROVIDER_SITE_OTHER): Payer: Medicare Other | Admitting: Podiatry

## 2016-06-28 ENCOUNTER — Encounter: Payer: Self-pay | Admitting: Podiatry

## 2016-06-28 VITALS — BP 157/71 | HR 61 | Resp 18

## 2016-06-28 DIAGNOSIS — M79676 Pain in unspecified toe(s): Secondary | ICD-10-CM | POA: Diagnosis not present

## 2016-06-28 DIAGNOSIS — B351 Tinea unguium: Secondary | ICD-10-CM

## 2016-06-28 DIAGNOSIS — E119 Type 2 diabetes mellitus without complications: Secondary | ICD-10-CM

## 2016-06-28 NOTE — Progress Notes (Signed)
Patient ID: Melissa Davis, female   DOB: [birth year], 75 y.o.   MRN: [id number]    Subjective: This patient presents today for scheduled visit complaining of painful toenails walking wearing shoes and request toenail debridement  Objective: Orientated 3 DP and PT pulses 2/4 bilaterally Capillary reflex immediate bilaterally Sensation to 10 g monofilament wire intact 4/5 bilaterally Vibratory sensation reactive bilaterally Ankle reflex equal and reactive bilaterally HAV left Hammertoe second left No open skin lesions bilaterally Atrophic dry skin bilaterally The toenails are brittle, elongated, discolored, incurvated, hypertrophic and tender to direct palpation 6-10 Corns distal third right toe and lateral left hallux  Assessment: Symptomatic onychomycoses 6-10 Type II diabetic without complications  Plan: Debridement of toenails mechanically and electrically 10 without any bleeding Debrided corns 2 without any bleeding  Reappoint 3 months

## 2016-06-28 NOTE — Patient Instructions (Signed)

## 2016-06-29 ENCOUNTER — Other Ambulatory Visit: Payer: Self-pay | Admitting: Internal Medicine

## 2016-07-28 ENCOUNTER — Encounter: Payer: Self-pay | Admitting: Internal Medicine

## 2016-07-28 ENCOUNTER — Encounter: Payer: Self-pay | Admitting: Physician Assistant

## 2016-07-28 ENCOUNTER — Ambulatory Visit (INDEPENDENT_AMBULATORY_CARE_PROVIDER_SITE_OTHER): Payer: Medicare Other | Admitting: Physician Assistant

## 2016-07-28 VITALS — BP 130/70 | HR 66 | Temp 98.2°F | Resp 14 | Ht 63.0 in | Wt 118.6 lb

## 2016-07-28 DIAGNOSIS — I10 Essential (primary) hypertension: Secondary | ICD-10-CM | POA: Diagnosis not present

## 2016-07-28 DIAGNOSIS — E119 Type 2 diabetes mellitus without complications: Secondary | ICD-10-CM

## 2016-07-28 DIAGNOSIS — Z136 Encounter for screening for cardiovascular disorders: Secondary | ICD-10-CM

## 2016-07-28 DIAGNOSIS — K219 Gastro-esophageal reflux disease without esophagitis: Secondary | ICD-10-CM

## 2016-07-28 DIAGNOSIS — Z79899 Other long term (current) drug therapy: Secondary | ICD-10-CM

## 2016-07-28 DIAGNOSIS — F325 Major depressive disorder, single episode, in full remission: Secondary | ICD-10-CM

## 2016-07-28 DIAGNOSIS — Z0001 Encounter for general adult medical examination with abnormal findings: Secondary | ICD-10-CM

## 2016-07-28 DIAGNOSIS — E782 Mixed hyperlipidemia: Secondary | ICD-10-CM

## 2016-07-28 DIAGNOSIS — H409 Unspecified glaucoma: Secondary | ICD-10-CM

## 2016-07-28 DIAGNOSIS — E559 Vitamin D deficiency, unspecified: Secondary | ICD-10-CM

## 2016-07-28 DIAGNOSIS — Z0000 Encounter for general adult medical examination without abnormal findings: Secondary | ICD-10-CM

## 2016-07-28 DIAGNOSIS — M8580 Other specified disorders of bone density and structure, unspecified site: Secondary | ICD-10-CM

## 2016-07-28 LAB — CBC WITH DIFFERENTIAL/PLATELET
BASOS ABS: 62 {cells}/uL (ref 0–200)
Basophils Relative: 1 %
EOS ABS: 62 {cells}/uL (ref 15–500)
Eosinophils Relative: 1 %
HEMATOCRIT: 39.4 % (ref 35.0–45.0)
HEMOGLOBIN: 13.1 g/dL (ref 11.7–15.5)
Lymphocytes Relative: 30 %
Lymphs Abs: 1860 cells/uL (ref 850–3900)
MCH: 30.8 pg (ref 27.0–33.0)
MCHC: 33.2 g/dL (ref 32.0–36.0)
MCV: 92.5 fL (ref 80.0–100.0)
MONO ABS: 496 {cells}/uL (ref 200–950)
MONOS PCT: 8 %
MPV: 9.9 fL (ref 7.5–12.5)
NEUTROS ABS: 3720 {cells}/uL (ref 1500–7800)
Neutrophils Relative %: 60 %
Platelets: 279 10*3/uL (ref 140–400)
RBC: 4.26 MIL/uL (ref 3.80–5.10)
RDW: 13.4 % (ref 11.0–15.0)
WBC: 6.2 10*3/uL (ref 3.8–10.8)

## 2016-07-28 LAB — HEMOGLOBIN A1C
HEMOGLOBIN A1C: 6 % — AB (ref <5.7)
MEAN PLASMA GLUCOSE: 126 mg/dL

## 2016-07-28 LAB — LIPID PANEL
CHOLESTEROL: 184 mg/dL (ref <200)
HDL: 65 mg/dL (ref >50)
LDL Cholesterol: 101 mg/dL — ABNORMAL HIGH (ref <100)
Total CHOL/HDL Ratio: 2.8 Ratio (ref <5.0)
Triglycerides: 88 mg/dL (ref <150)
VLDL: 18 mg/dL (ref <30)

## 2016-07-28 LAB — HEPATIC FUNCTION PANEL
ALBUMIN: 4.2 g/dL (ref 3.6–5.1)
ALK PHOS: 66 U/L (ref 33–130)
ALT: 12 U/L (ref 6–29)
AST: 18 U/L (ref 10–35)
Bilirubin, Direct: 0.1 mg/dL (ref <=0.2)
Indirect Bilirubin: 0.6 mg/dL (ref 0.2–1.2)
Total Bilirubin: 0.7 mg/dL (ref 0.2–1.2)
Total Protein: 7.2 g/dL (ref 6.1–8.1)

## 2016-07-28 LAB — BASIC METABOLIC PANEL WITH GFR
BUN: 22 mg/dL (ref 7–25)
CHLORIDE: 103 mmol/L (ref 98–110)
CO2: 28 mmol/L (ref 20–31)
CREATININE: 0.69 mg/dL (ref 0.60–0.93)
Calcium: 9.4 mg/dL (ref 8.6–10.4)
GFR, Est African American: >89 mL/min (ref >=60)
GFR, Est Non African American: 86 mL/min (ref >=60)
GLUCOSE: 86 mg/dL (ref 65–99)
Potassium: 3.8 mmol/L (ref 3.5–5.3)
Sodium: 141 mmol/L (ref 135–146)

## 2016-07-28 LAB — TSH: TSH: 0.97 mIU/L

## 2016-07-28 LAB — MAGNESIUM: Magnesium: 2 mg/dL (ref 1.5–2.5)

## 2016-07-28 NOTE — Patient Instructions (Signed)

## 2016-07-28 NOTE — Progress Notes (Signed)
CPE AND FOLLOW UP  Assessment and Plan:   Hypertension - continue medications, DASH diet, exercise and monitor at home. Call if greater than 130/80.  - CBC with Differential - BASIC METABOLIC PANEL WITH GFR - Hepatic function panel - TSH   GERD (gastroesophageal reflux disease) -controlled, diet discussed  T2 NIDDM Discussed general issues about diabetes pathophysiology and management., Educational material distributed., Suggested low cholesterol diet., Encouraged aerobic exercise., Discussed foot care., Reminded to get yearly retinal exam. - Hemoglobin A1c - Insulin, fasting - HM DIABETES FOOT EXAM   Hyperlipidemia - Lipid panel  Vitamin D deficiency - Vit D  25 hydroxy (rtn osteoporosis monitoring)   Depression, remission -remission  Encounter for long-term (current) use of other medications - Magnesium  Osteopenia Due 2 years, continue vitamin D, calcium  Glaucoma, unspecified glaucoma type, unspecified laterality Continue eye doctor appointment  Future Appointments Date Time Provider [place]  09/27/2016 8:15 AM Gean Birchwood, DPM [facility]  11/02/2016 10:30 AM Unk Pinto, MD [facility] None  08/01/2017 9:00 AM Vicie Mutters, PA-C [facility] None      Subjective:   Melissa Davis is a 76 y.o. female who presents for CPE and 3 month follow up on hypertension, prediabetes, hyperlipidemia, vitamin D def.   Her blood pressure has been controlled at home, she is on bispoprolol 32m and doing well on this, today their BP is BP: 130/70 She does workout, she is no longer keeping her 454year old grand son and her grand kids just moved out 1 block down the street which decreases some stress.  She denies chest pain, shortness of breath, dizziness.  She is not on cholesterol medication and denies myalgias. Her cholesterol is at goal. The cholesterol last visit was:   Lab Results  Component Value Date   CHOL 181 05/05/2016   HDL 71 05/05/2016   LDLCALC 96 05/05/2016   TRIG 68 05/05/2016   CHOLHDL 2.5 05/05/2016   She has been working on diet and exercise for diabetes without complications, she is on metformin, she is on ACE, she is on bASA, and denies paresthesia of the feet, polydipsia and polyuria. Last A1C in the office was:  Lab Results  Component Value Date   HGBA1C 5.9 (H) 05/05/2016   Lab Results  Component Value Date   GFRNONAA 79 05/05/2016   Patient is on Vitamin D supplement, 1000 IU but occ misses some. Lab Results  Component Value Date   VD25OH 22 (L) 05/05/2016   She has glaucoma and folllows very closely with her eye doctor.    Medication Review Current Outpatient Prescriptions on File Prior to Visit  Medication Sig Dispense Refill  . amLODipine (NORVASC) 10 MG tablet Take 1 tablet (10 mg total) by mouth daily. 90 tablet 1  . amLODipine (NORVASC) 10 MG tablet TAKE 1 TABLET BY MOUTH EVERY DAY 90 tablet 0  . bisoprolol (ZEBETA) 5 MG tablet Take 1 tablet (5 mg total) by mouth daily. 30 tablet 3  . Blood Glucose Monitoring Suppl (ONE TOUCH ULTRA SYSTEM KIT) W/DEVICE KIT 1 kit by Does not apply route once. Check glucose 1 time daily-DX-E11.2 1 each 0  . clorazepate (TRANXENE-T) 7.5 MG tablet Take 1 tablet (7.5 mg total) by mouth 2 (two) times daily as needed for anxiety. 60 tablet 0  . glucose blood (ONE TOUCH ULTRA TEST) test strip Check glucose 1 time daily-DX-E11.2 100 each 3  . Lancets (ONETOUCH ULTRASOFT) lancets Check glucose 1 time daily-DX-E11.2 100 each PRN  .  metFORMIN (GLUCOPHAGE-XR) 500 MG 24 hr tablet TAKE 4 TABLETS BY MOUTH DAILY OR AS DIRECTED FOR DIABETES 360 tablet 1  . quinapril (ACCUPRIL) 40 MG tablet Take 1 tablet (40 mg total) by mouth daily. 90 tablet 0  . TRAVATAN Z 0.004 % SOLN ophthalmic solution Place 1 drop into both eyes at bedtime.      No current facility-administered medications on file prior to visit.     Current Problems (verified) Patient Active Problem List   Diagnosis  Date Noted  . Body mass index (BMI) of 20.0-20.9 in adult 04/22/2015  . Osteopenia 07/03/2014  . Medication management 09/26/2013  . Glaucoma 09/26/2013  . T2_NIDDM   . Hyperlipidemia   . Hypertension   . Vitamin D deficiency   . GERD (gastroesophageal reflux disease)   . Depression, major, in remission ([place])     Screening Tests Immunization History  Administered Date(s) Administered  . Influenza Split 05/01/2013  . Influenza, High Dose Seasonal PF 03/31/2014, 04/22/2015, 05/08/2016  . Pneumococcal Conjugate-13 07/03/2014  . Pneumococcal Polysaccharide-23 08/05/1998  . Td 06/17/2012    Preventative care: Tetanus: 2013  Pneumovax: 2000  Prevnar 13: 2015 Flu vaccine: 2017 Zostavax:  Cost 95 dollars, she will check with [facility] on price first  Pap: 2010 neg declines another MGM: 03/2016 CATEGORY D DEXA: 03/2016 Osteopenia  Colonoscopy: 2011 due 10 years  EGD: N/A DEE: 04/2013 CXR 10/2013 Cath: 2010 normal EF 60% CT cervical spine 07/2013 CT head 07/2013  Names of Other Physician/Practitioners you currently use: 1.  [facility]- here for primary care 2. Dr. Bennye Alm, dentist, last visit Oct 2017, right eye cat surgery, q 3 months Patient Care Team: Unk Pinto, MD as PCP - General (Internal Medicine) Rob Hickman, MD as Consulting Physician (Ophthalmology) June 2017 Lafayette Dragon, MD as Consulting Physician (Gastroenterology) Kendell Bane, MD as Consulting Physician (Podiatry)  Allergies Allergies  Allergen Reactions  . Buspirone   . Clonidine Derivatives   . Cymbalta [Duloxetine Hcl]   . Elavil [Amitriptyline]   . Hytrin [Terazosin]   . Nizofenone   . Paxil [Paroxetine Hcl]   . Prozac [Fluoxetine Hcl]   . Zoloft [Sertraline Hcl]    SURGICAL HISTORY She  has a past surgical history that includes Cholecystectomy and Total abdominal hysterectomy w/ bilateral salpingoophorectomy (1990). FAMILY HISTORY Her family  history includes Alzheimer's disease in her father; CVA in her father; Heart attack in her mother; Hypertension in her father and mother; Ulcers in her mother. SOCIAL HISTORY She  reports that she has never smoked. She does not have any smokeless tobacco history on file. She reports that she does not drink alcohol or use drugs.  Review of Systems  Constitutional: Negative for chills, fever and malaise/fatigue.  HENT: Negative for congestion, ear pain and sore throat.   Eyes: Negative.   Respiratory: Negative for cough, shortness of breath and wheezing.   Cardiovascular: Negative for chest pain, palpitations and leg swelling.  Gastrointestinal: Negative for abdominal pain, blood in stool, constipation, diarrhea, heartburn and melena.  Genitourinary: Negative.   Skin: Negative.   Neurological: Negative for dizziness, sensory change, loss of consciousness and headaches.  Psychiatric/Behavioral: Negative for depression. The patient is nervous/anxious. The patient does not have insomnia.      Objective:   Blood pressure 130/70, pulse 66, temperature 98.2 F (36.8 C), resp. rate 14, height 5' 3"  (1.6 m), weight 118 lb 9.6 oz (53.8 kg), SpO2 98 %. Body mass index is 21.01 kg/m.  General appearance: alert, no distress, WD/WN,  female HEENT: normocephalic, sclerae anicteric, TMs pearly, nares patent, no discharge or erythema, pharynx normal Oral cavity: MMM, no lesions Neck: supple, no lymphadenopathy, no thyromegaly, no masses Heart: RRR, normal S1, S2, no murmurs Lungs: CTA bilaterally, no wheezes, rhonchi, or rales Abdomen: +bs, soft, non tender, non distended, no masses, no hepatomegaly, no splenomegaly Musculoskeletal: nontender, no swelling, no obvious deformity, mild cervical kyphosis Extremities: no edema, no cyanosis, no clubbing Pulses: 2+ symmetric, upper and lower extremities, normal cap refill Neurological: alert, oriented x 3, CN2-12 intact, strength normal upper extremities  and lower extremities, sensation normal throughout, DTRs 2+ throughout, no cerebellar signs, gait antalgic with cane Psychiatric: normal affect, behavior normal, pleasant  Breast: defer Gyn: defer Rectal: defer  EKG: WNL, no ST changes  Vicie Mutters, PA-C   07/28/2016

## 2016-07-29 LAB — URINALYSIS, ROUTINE W REFLEX MICROSCOPIC
BILIRUBIN URINE: NEGATIVE
Glucose, UA: NEGATIVE
Hgb urine dipstick: NEGATIVE
Leukocytes, UA: NEGATIVE
NITRITE: NEGATIVE
PH: 7.5 (ref 5.0–8.0)
Protein, ur: NEGATIVE
Specific Gravity, Urine: 1.008 (ref 1.001–1.035)

## 2016-07-29 LAB — VITAMIN D 25 HYDROXY (VIT D DEFICIENCY, FRACTURES): Vit D, 25-Hydroxy: 22 ng/mL — ABNORMAL LOW (ref 30–100)

## 2016-07-29 LAB — MICROALBUMIN / CREATININE URINE RATIO
CREATININE, URINE: 35 mg/dL (ref 20–320)
MICROALB UR: 0.2 mg/dL
Microalb Creat Ratio: 6 mcg/mg creat (ref <30)

## 2016-07-31 NOTE — Progress Notes (Signed)
LVM for pt to return office call for LAB results.

## 2016-08-02 ENCOUNTER — Other Ambulatory Visit: Payer: Self-pay | Admitting: Internal Medicine

## 2016-08-02 DIAGNOSIS — I10 Essential (primary) hypertension: Secondary | ICD-10-CM

## 2016-08-11 ENCOUNTER — Other Ambulatory Visit: Payer: Self-pay | Admitting: Physician Assistant

## 2016-08-14 ENCOUNTER — Other Ambulatory Visit: Payer: Self-pay

## 2016-08-14 NOTE — Telephone Encounter (Signed)
Note created in error  Meds needed prior authorization & not refills

## 2016-09-27 ENCOUNTER — Ambulatory Visit (INDEPENDENT_AMBULATORY_CARE_PROVIDER_SITE_OTHER): Payer: Medicare Other | Admitting: Podiatry

## 2016-09-27 ENCOUNTER — Encounter: Payer: Self-pay | Admitting: Podiatry

## 2016-09-27 VITALS — BP 137/50 | HR 69 | Resp 18

## 2016-09-27 DIAGNOSIS — M79676 Pain in unspecified toe(s): Secondary | ICD-10-CM | POA: Diagnosis not present

## 2016-09-27 DIAGNOSIS — L84 Corns and callosities: Secondary | ICD-10-CM

## 2016-09-27 DIAGNOSIS — B351 Tinea unguium: Secondary | ICD-10-CM

## 2016-09-27 DIAGNOSIS — E119 Type 2 diabetes mellitus without complications: Secondary | ICD-10-CM | POA: Diagnosis not present

## 2016-09-27 NOTE — Progress Notes (Signed)
Patient ID: Melissa Davis, female   DOB: [birth year], 76 y.o.   MRN: [id number]    Subjective: This patient presents today for scheduled visit complaining of painful toenails walking wearing shoes and request toenail debridement  Objective: Orientated 3 DP and PT pulses 2/4 bilaterally Capillary reflex immediate bilaterally Sensation to 10 g monofilament wire intact 4/5 bilaterally Vibratory sensation reactive bilaterally Ankle reflex equal and reactive bilaterally HAV left Hammertoe second left No open skin lesions bilaterally Atrophic dry skin bilaterally The toenails are brittle, elongated, discolored, incurvated, hypertrophic and tender to direct palpation 6-10 Corns distal third right toe and lateral left hallux  Assessment: Symptomatic onychomycoses 6-10 Type II diabetic without complications Corns 2  Plan: Debridement of toenails mechanically and electrically 10 without any bleeding Debrided corns 2 without any bleeding  Reappoint 3 months

## 2016-09-27 NOTE — Patient Instructions (Signed)

## 2016-10-05 ENCOUNTER — Other Ambulatory Visit: Payer: Self-pay | Admitting: Physician Assistant

## 2016-10-27 ENCOUNTER — Other Ambulatory Visit: Payer: Self-pay | Admitting: Internal Medicine

## 2016-10-27 DIAGNOSIS — I10 Essential (primary) hypertension: Secondary | ICD-10-CM

## 2016-10-31 ENCOUNTER — Other Ambulatory Visit: Payer: Self-pay | Admitting: Internal Medicine

## 2016-11-02 ENCOUNTER — Ambulatory Visit (INDEPENDENT_AMBULATORY_CARE_PROVIDER_SITE_OTHER): Payer: Medicare Other | Admitting: Internal Medicine

## 2016-11-02 ENCOUNTER — Encounter: Payer: Self-pay | Admitting: Internal Medicine

## 2016-11-02 ENCOUNTER — Other Ambulatory Visit: Payer: Self-pay | Admitting: Physician Assistant

## 2016-11-02 VITALS — BP 122/50 | HR 56 | Temp 97.5°F | Resp 16 | Ht 63.0 in | Wt 118.6 lb

## 2016-11-02 DIAGNOSIS — E782 Mixed hyperlipidemia: Secondary | ICD-10-CM | POA: Diagnosis not present

## 2016-11-02 DIAGNOSIS — I10 Essential (primary) hypertension: Secondary | ICD-10-CM

## 2016-11-02 DIAGNOSIS — E559 Vitamin D deficiency, unspecified: Secondary | ICD-10-CM | POA: Diagnosis not present

## 2016-11-02 DIAGNOSIS — E119 Type 2 diabetes mellitus without complications: Secondary | ICD-10-CM | POA: Diagnosis not present

## 2016-11-02 DIAGNOSIS — Z79899 Other long term (current) drug therapy: Secondary | ICD-10-CM | POA: Diagnosis not present

## 2016-11-02 LAB — LIPID PANEL
CHOL/HDL RATIO: 3.2 ratio (ref <5.0)
Cholesterol: 196 mg/dL (ref <200)
HDL: 62 mg/dL (ref >50)
LDL Cholesterol: 109 mg/dL — ABNORMAL HIGH (ref <100)
Triglycerides: 124 mg/dL (ref <150)
VLDL: 25 mg/dL (ref <30)

## 2016-11-02 LAB — CBC WITH DIFFERENTIAL/PLATELET
BASOS ABS: 0 {cells}/uL (ref 0–200)
Basophils Relative: 0 %
EOS ABS: 71 {cells}/uL (ref 15–500)
EOS PCT: 1 %
HEMATOCRIT: 39.4 % (ref 35.0–45.0)
HEMOGLOBIN: 13.1 g/dL (ref 11.7–15.5)
LYMPHS ABS: 2201 {cells}/uL (ref 850–3900)
Lymphocytes Relative: 31 %
MCH: 30.4 pg (ref 27.0–33.0)
MCHC: 33.2 g/dL (ref 32.0–36.0)
MCV: 91.4 fL (ref 80.0–100.0)
MONO ABS: 639 {cells}/uL (ref 200–950)
MPV: 10 fL (ref 7.5–12.5)
Monocytes Relative: 9 %
NEUTROS ABS: 4189 {cells}/uL (ref 1500–7800)
NEUTROS PCT: 59 %
Platelets: 291 10*3/uL (ref 140–400)
RBC: 4.31 MIL/uL (ref 3.80–5.10)
RDW: 13.2 % (ref 11.0–15.0)
WBC: 7.1 10*3/uL (ref 3.8–10.8)

## 2016-11-02 LAB — HEPATIC FUNCTION PANEL
ALT: 14 U/L (ref 6–29)
AST: 19 U/L (ref 10–35)
Albumin: 4.2 g/dL (ref 3.6–5.1)
Alkaline Phosphatase: 73 U/L (ref 33–130)
BILIRUBIN DIRECT: 0.1 mg/dL (ref <=0.2)
BILIRUBIN INDIRECT: 0.5 mg/dL (ref 0.2–1.2)
TOTAL PROTEIN: 7 g/dL (ref 6.1–8.1)
Total Bilirubin: 0.6 mg/dL (ref 0.2–1.2)

## 2016-11-02 LAB — MAGNESIUM: MAGNESIUM: 2 mg/dL (ref 1.5–2.5)

## 2016-11-02 LAB — BASIC METABOLIC PANEL WITH GFR
BUN: 19 mg/dL (ref 7–25)
CALCIUM: 9.5 mg/dL (ref 8.6–10.4)
CHLORIDE: 105 mmol/L (ref 98–110)
CO2: 28 mmol/L (ref 20–31)
CREATININE: 0.78 mg/dL (ref 0.60–0.93)
GFR, Est African American: 87 mL/min (ref >=60)
GFR, Est Non African American: 75 mL/min (ref >=60)
GLUCOSE: 95 mg/dL (ref 65–99)
Potassium: 4.4 mmol/L (ref 3.5–5.3)
SODIUM: 143 mmol/L (ref 135–146)

## 2016-11-02 LAB — TSH: TSH: 1.11 mIU/L

## 2016-11-02 NOTE — Progress Notes (Signed)
This very nice 76 y.o. WWF presents for 3 month follow up with Hypertension, Hyperlipidemia, T2_NIDDM and Vitamin D Deficiency.      Patient is treated for HTN since 1994 & BP has been controlled at home. Today's BP is 122/50 - at goal. Patient has had no complaints of any cardiac type chest pain, palpitations, dyspnea/orthopnea/PND, dizziness, claudication, or dependent edema.     Patient's daughter forwarded a letter thru Quentin Mulling, PA-C expressing concerns re: her mother's short term memory. And MMSE is administered      Hyperlipidemia is controlled with diet & meds. Patient denies myalgias or other med SE's. Last Lipids were at goal: Lab Results  Component Value Date   CHOL 184 07/28/2016   HDL 65 07/28/2016   LDLCALC 101 (H) 07/28/2016   TRIG 88 07/28/2016   CHOLHDL 2.8 07/28/2016      Also, the patient has history of T2_NIDDM since 2004 and has had no symptoms of reactive hypoglycemia, diabetic polys, paresthesias or visual blurring.  Last A1c was not at goal: Lab Results  Component Value Date   HGBA1C 6.0 (H) 07/28/2016      Further, the patient also has history of Vitamin D Deficiency ("21" in 2016)  and supplements vitamin D without any suspected side-effects. Last vitamin D was still very low:  Lab Results  Component Value Date   VD25OH 22 (L) 07/28/2016   Current Outpatient Prescriptions on File Prior to Visit  Medication Sig  . amLODipine  10 MG tablet TAKE 1 TAB DAILY  . bisoprolol  5 MG tablet TAKE 1 TAB DAILY  . Clorazepate 7.5 MG tablet Take 1 tab 2 x daily as needed for anxiety.  . metFORMIN-XR 500 MG  TAKE 4 TAB DAILY   . quinapril  40 MG tablet TAKE 1 TAB DAILY  . TRAVATANSOLN ophth soln Place 1 drop into both eyes at bedtime.    Allergies  Allergen Reactions  . Buspirone   . Clonidine Derivatives   . Cymbalta [Duloxetine Hcl]   . Elavil [Amitriptyline]   . Hytrin [Terazosin]   . Nizofenone   . Paxil [Paroxetine Hcl]   . Prozac [Fluoxetine  Hcl]   . Zoloft [Sertraline Hcl]    PMHx:   Past Medical History:  Diagnosis Date  . Depression   . GERD (gastroesophageal reflux disease)   . Hyperlipidemia   . Hypertension   . Type II or unspecified type diabetes mellitus without mention of complication, not stated as uncontrolled   . Vitamin D deficiency    Immunization History  Administered Date(s) Administered  . Influenza Split 05/01/2013  . Influenza, High Dose Seasonal PF 03/31/2014, 04/22/2015, 05/08/2016  . Pneumococcal Conjugate-13 07/03/2014  . Pneumococcal Polysaccharide-23 08/05/1998  . Td 06/17/2012   Past Surgical History:  Procedure Laterality Date  . CHOLECYSTECTOMY    . TOTAL ABDOMINAL HYSTERECTOMY W/ BILATERAL SALPINGOOPHORECTOMY  1990   FHx:    Reviewed / unchanged  SHx:    Reviewed / unchanged  Systems Review:  Constitutional: Denies fever, chills, wt changes, headaches, insomnia, fatigue, night sweats, change in appetite. Eyes: Denies redness, blurred vision, diplopia, discharge, itchy, watery eyes.  ENT: Denies discharge, congestion, post nasal drip, epistaxis, sore throat, earache, hearing loss, dental pain, tinnitus, vertigo, sinus pain, snoring.  CV: Denies chest pain, palpitations, irregular heartbeat, syncope, dyspnea, diaphoresis, orthopnea, PND, claudication or edema. Respiratory: denies cough, dyspnea, DOE, pleurisy, hoarseness, laryngitis, wheezing.  Gastrointestinal: Denies dysphagia, odynophagia, heartburn, reflux, water brash, abdominal  pain or cramps, nausea, vomiting, bloating, diarrhea, constipation, hematemesis, melena, hematochezia  or hemorrhoids. Genitourinary: Denies dysuria, frequency, urgency, nocturia, hesitancy, discharge, hematuria or flank pain. Musculoskeletal: Denies arthralgias, myalgias, stiffness, jt. swelling, pain, limping or strain/sprain.  Skin: Denies pruritus, rash, hives, warts, acne, eczema or change in skin lesion(s). Neuro: No weakness, tremor, incoordination,  spasms, paresthesia or pain. Psychiatric: Denies confusion, memory loss or sensory loss. Endo: Denies change in weight, skin or hair change.  Heme/Lymph: No excessive bleeding, bruising or enlarged lymph nodes.  Physical Exam  BP (!) 122/50   Pulse (!) 56   Temp 97.5 F (36.4 C)   Resp 16   Ht  (1.6 m)   Wt 118 lb 9.6 oz (53.8 kg)   BMI 21.01 kg/m   Appears well nourished, well groomed  and in no distress.  Eyes: PERRLA, EOMs, conjunctiva no swelling or erythema. Sinuses: No frontal/maxillary tenderness ENT/Mouth: EAC's clear, TM's nl w/o erythema, bulging. Nares clear w/o erythema, swelling, exudates. Oropharynx clear without erythema or exudates. Oral hygiene is good. Tongue normal, non obstructing. Hearing intact.  Neck: Supple. Thyroid nl. Car 2+/2+ without bruits, nodes or JVD. Chest: Respirations nl with BS clear & equal w/o rales, rhonchi, wheezing or stridor.  Cor: Heart sounds normal w/ regular rate and rhythm without sig. murmurs, gallops, clicks or rubs. Peripheral pulses normal and equal  without edema.  Abdomen: Soft & bowel sounds normal. Non-tender w/o guarding, rebound, hernias, masses or organomegaly.  Lymphatics: Unremarkable.  Musculoskeletal: Full ROM all peripheral extremities, joint stability, 5/5 strength and normal gait.  Skin: Warm, dry without exposed rashes, lesions or ecchymosis apparent.  Neuro: Cranial nerves intact, reflexes equal bilaterally. Sensory-motor testing grossly intact. Tendon reflexes grossly intact.   MMSE administered and patient scored 29/30 with only difficulty noted in drawing overlapping convergent pentagrams and spatial disorientation with aligning numbers unequally spaced with the clock test.  Pysch: Alert & oriented x 3.  Insight and judgement nl & appropriate. No ideations.  Assessment and Plan:  1. Essential hypertension  - Continue medication, monitor blood pressure at home.  - Continue DASH diet. Reminder to go to  the ER if any CP,  SOB, nausea, dizziness, severe HA, changes vision/speech,  left arm numbness and tingling and jaw pain. - CBC with Differential/Platelet - BASIC METABOLIC PANEL WITH GFR - Magnesium - TSH  2. Mixed hyperlipidemia  - Continue diet/meds, exercise,& lifestyle modifications.  - Continue monitor periodic cholesterol/liver & renal functions  - Hepatic function panel - Lipid panel - TSH  3. T2_NIDDM  - Continue diet, exercise, lifestyle modifications.  - Monitor appropriate labs.  - Hemoglobin A1c - Insulin, random  4. Vitamin D deficiency  - Continue supplementation.  - VITAMIN D 25 Hydroxy   5. Medication management  - CBC with Differential/Platelet - BASIC METABOLIC PANEL WITH GFR - Hepatic function panel - Magnesium - Lipid panel - TSH - Hemoglobin A1c - Insulin, random - VITAMIN D 25 Hydroxy       Discussed  regular exercise, BP monitoring, weight control to achieve/maintain BMI less than 25 and discussed med and SE's. Recommended labs to assess and monitor clinical status with further disposition pending results of labs. Over 30 minutes of exam, counseling, chart review was performed.

## 2016-11-02 NOTE — Patient Instructions (Signed)

## 2016-11-03 LAB — HEMOGLOBIN A1C
HEMOGLOBIN A1C: 5.7 % — AB (ref <5.7)
MEAN PLASMA GLUCOSE: 117 mg/dL

## 2016-11-03 LAB — VITAMIN D 25 HYDROXY (VIT D DEFICIENCY, FRACTURES): VIT D 25 HYDROXY: 59 ng/mL (ref 30–100)

## 2016-11-03 LAB — INSULIN, RANDOM: Insulin: 4 u[IU]/mL (ref 2.0–19.6)

## 2017-01-02 ENCOUNTER — Ambulatory Visit (INDEPENDENT_AMBULATORY_CARE_PROVIDER_SITE_OTHER): Payer: Medicare Other | Admitting: Podiatry

## 2017-01-02 ENCOUNTER — Encounter: Payer: Self-pay | Admitting: Podiatry

## 2017-01-02 DIAGNOSIS — B351 Tinea unguium: Secondary | ICD-10-CM | POA: Diagnosis not present

## 2017-01-02 DIAGNOSIS — M79676 Pain in unspecified toe(s): Secondary | ICD-10-CM | POA: Diagnosis not present

## 2017-01-02 DIAGNOSIS — L84 Corns and callosities: Secondary | ICD-10-CM | POA: Diagnosis not present

## 2017-01-02 DIAGNOSIS — E119 Type 2 diabetes mellitus without complications: Secondary | ICD-10-CM

## 2017-01-02 NOTE — Patient Instructions (Signed)
Removed Band-Aid on the third right toe in 1-3 days and apply topical antibiotic ointment and Band-Aid until a scab forms  Diabetes and Foot Care Diabetes may cause you to have problems because of poor blood supply (circulation) to your feet and legs. This may cause the skin on your feet to become thinner, break easier, and heal more slowly. Your skin may become dry, and the skin may peel and crack. You may also have nerve damage in your legs and feet causing decreased feeling in them. You may not notice minor injuries to your feet that could lead to infections or more serious problems. Taking care of your feet is one of the most important things you can do for yourself. Follow these instructions at home:  Wear shoes at all times, even in the house. Do not go barefoot. Bare feet are easily injured.  Check your feet daily for blisters, cuts, and redness. If you cannot see the bottom of your feet, use a mirror or ask someone for help.  Wash your feet with warm water (do not use hot water) and mild soap. Then pat your feet and the areas between your toes until they are completely dry. Do not soak your feet as this can dry your skin.  Apply a moisturizing lotion or petroleum jelly (that does not contain alcohol and is unscented) to the skin on your feet and to dry, brittle toenails. Do not apply lotion between your toes.  Trim your toenails straight across. Do not dig under them or around the cuticle. File the edges of your nails with an emery board or nail file.  Do not cut corns or calluses or try to remove them with medicine.  Wear clean socks or stockings every day. Make sure they are not too tight. Do not wear knee-high stockings since they may decrease blood flow to your legs.  Wear shoes that fit properly and have enough cushioning. To break in new shoes, wear them for just a few hours a day. This prevents you from injuring your feet. Always look in your shoes before you put them on to be  sure there are no objects inside.  Do not cross your legs. This may decrease the blood flow to your feet.  If you find a minor scrape, cut, or break in the skin on your feet, keep it and the skin around it clean and dry. These areas may be cleansed with mild soap and water. Do not cleanse the area with peroxide, alcohol, or iodine.  When you remove an adhesive bandage, be sure not to damage the skin around it.  If you have a wound, look at it several times a day to make sure it is healing.  Do not use heating pads or hot water bottles. They may burn your skin. If you have lost feeling in your feet or legs, you may not know it is happening until it is too late.  Make sure your health care provider performs a complete foot exam at least annually or more often if you have foot problems. Report any cuts, sores, or bruises to your health care provider immediately. Contact a health care provider if:  You have an injury that is not healing.  You have cuts or breaks in the skin.  You have an ingrown nail.  You notice redness on your legs or feet.  You feel burning or tingling in your legs or feet.  You have pain or cramps in your legs and feet.  Your legs or feet are numb.  Your feet always feel cold. Get help right away if:  There is increasing redness, swelling, or pain in or around a wound.  There is a red line that goes up your leg.  Pus is coming from a wound.  You develop a fever or as directed by your health care provider.  You notice a bad smell coming from an ulcer or wound. This information is not intended to replace advice given to you by your health care provider. Make sure you discuss any questions you have with your health care provider. Document Released: 06/30/2000 Document Revised: 12/09/2015 Document Reviewed: 12/10/2012 Elsevier Interactive Patient Education  2017 [employer].

## 2017-01-02 NOTE — Progress Notes (Signed)
Patient ID: Melissa Davis, female   DOB: [birth year], 76 y.o.   MRN: [id number]    Subjective: This patient presents today for scheduled visit complaining of painful toenails walking wearing shoes and request toenail debridement  Objective: Orientated 3 DP and PT pulses 2/4 bilaterally Capillary reflex immediate bilaterally Sensation to 10 g monofilament wire intact 4/5 bilaterally Vibratory sensation reactive bilaterally Ankle reflex equal and reactive bilaterally HAV left Hammertoe second left No open skin lesions bilaterally Atrophic dry skin bilaterally Absent hair growth bilaterally The toenails are brittle, elongated, discolored, incurvated, hypertrophic and tender to direct palpation 6-10 Corns distal third right toe   Assessment: Symptomatic onychomycoses 6-10 Type II diabetic without complications Corns 2  Plan: Debridement of toenails mechanically and electrically 10 with slight bleeding distal third right toe. Apply topical antibiotic ointment and Band-Aid. Patient instructed removed Band-Aid 1-3 days and continue applying topical antibiotic ointment and Band-Aid until a scab forms  Debrided corn 1 without any bleeding  Reappoint 3 months

## 2017-01-06 ENCOUNTER — Other Ambulatory Visit: Payer: Self-pay | Admitting: Internal Medicine

## 2017-01-06 DIAGNOSIS — I10 Essential (primary) hypertension: Secondary | ICD-10-CM

## 2017-02-06 ENCOUNTER — Encounter: Payer: Self-pay | Admitting: Internal Medicine

## 2017-02-09 ENCOUNTER — Other Ambulatory Visit: Payer: Self-pay | Admitting: Internal Medicine

## 2017-02-09 NOTE — Telephone Encounter (Signed)
Please call Clorazepate

## 2017-02-12 ENCOUNTER — Other Ambulatory Visit: Payer: Self-pay | Admitting: *Deleted

## 2017-02-12 MED ORDER — ALPRAZOLAM 0.5 MG PO TABS: ORAL_TABLET | ORAL | 0 refills | Status: DC

## 2017-02-14 NOTE — Progress Notes (Signed)
MEDICARE ANNUAL WELLNESS VISIT AND FOLLOW UP  Assessment:   Essential hypertension - continue medications, DASH diet, exercise and monitor at home. Call if greater than 130/80.  -     CBC with Differential/Platelet -     BASIC METABOLIC PANEL WITH GFR -     Hepatic function panel -     TSH  T2_NIDDM Discussed general issues about diabetes pathophysiology and management., Educational material distributed., Suggested low cholesterol diet., Encouraged aerobic exercise., Discussed foot care., Reminded to get yearly retinal exam.  Gastroesophageal reflux disease, esophagitis presence not specified Continue PPI/H2 blocker, diet discussed  Osteopenia, unspecified location Osteopenia- get dexa, continue Vit D and Ca, weight bearing exercises  Vitamin D deficiency Continue supplement  Mixed hyperlipidemia -continue medications, check lipids, decrease fatty foods, increase activity.   Depression, major, in remission ([place]) - continue medications, stress management techniques discussed, increase water, good sleep hygiene discussed, increase exercise, and increase veggies.   Medication management  Glaucoma, unspecified glaucoma type, unspecified laterality Follow up eye doctor  Encounter for Medicare annual wellness exam  Advanced care counseling/discussion Discussed advanced care planning with patient  Left leg swelling ? From fall, no pain with manipulation so no need for Xray, get uric acid and [place] Rule out DVT -     Uric acid -     VAS [place] LOWER EXTREMITY VENOUS (DVT); Future  Syncope, unspecified syncope type ? Syncope, has unknown down time Will refer to cardiology, normal EKG in the office No SOB, no CP, no evidence of seizure activity If happens again or any CP, SOB, etc patient will go to ER -     CBC with Differential/Platelet -     BASIC METABOLIC PANEL WITH GFR -     Hepatic function panel -     TSH -     Urinalysis, Routine w reflex microscopic -     Urine  Culture -     EKG 12-Lead -     MR Brain Wo Contrast; Future  Urinary body odor -     Urinalysis, Routine w reflex microscopic -     Urine Culture  Other orders -     metFORMIN (GLUCOPHAGE-XR) 500 MG 24 hr tablet; TAKE 1 TABLET BY MOUTH DAILY OR AS DIRECTED FOR DIABETES    Future Appointments Date Time Provider [place]  04/02/2017 9:45 AM Gean Birchwood, DPM [facility]  05/24/2017 9:30 AM Unk Pinto, MD [facility] None  08/01/2017 9:00 AM Vicie Mutters, PA-C [facility] None    Plan:   During the course of the visit the patient was educated and counseled about appropriate screening and preventive services including:    Pneumococcal vaccine   Influenza vaccine  Td vaccine  Screening electrocardiogram  Screening mammography  Bone densitometry screening  Colorectal cancer screening  Diabetes screening  Glaucoma screening  Nutrition counseling   Advanced directives: given info/requested   Subjective:   Melissa Davis is a 76 y.o. female who presents for Medicare Annual Wellness Visit and acute visit for ankle pain.   Patient had a fall 02/13/2017 x 2, hurt left ankle. Daughter is here, gives story, she states she grabs her chest a lot, she was having GERD and vomited x 2, bilious, then she fell /syncope, does not recall falling, woke up on the floor, no witnesses. No loss of bladder control, no pain or confusion afterwards. States she had the vomiting and fell after she took her grandkids to the hospital.  Her blood pressure  has been controlled at home, she is on 1/2 of the atenolol due to low BP and doing well on this, today their BP is BP: 124/76 She does workout, she is no longer keeping her 22 year old grand son and her grand kids just moved out 1 block down the street which decreases some stress.  She denies chest pain, shortness of breath, dizziness.  She is not on cholesterol medication and denies myalgias. Her cholesterol is at goal.  The cholesterol last visit was:   Lab Results  Component Value Date   CHOL 196 11/02/2016   HDL 62 11/02/2016   LDLCALC 109 (H) 11/02/2016   TRIG 124 11/02/2016   CHOLHDL 3.2 11/02/2016   She has been working on diet and exercise for diabetes without complications, she is on metformin, she is on ACE, she is on bASA, and denies paresthesia of the feet, polydipsia and polyuria. Last A1C in the office was:  Lab Results  Component Value Date   HGBA1C 5.7 (H) 11/02/2016   Lab Results  Component Value Date   GFRNONAA 75 11/02/2016   Patient is on Vitamin D supplement, 1000 IU but occ misses some. Lab Results  Component Value Date   VD25OH 56 11/02/2016   She has glaucoma and folllows very closely with her eye doctor.  BMI is Body mass index is 19.88 kg/m., she is working on diet and exercise. Wt Readings from Last 3 Encounters:  02/15/17 112 lb 3.2 oz (50.9 kg)  11/02/16 118 lb 9.6 oz (53.8 kg)  07/28/16 118 lb 9.6 oz (53.8 kg)     Medication Review Current Outpatient Prescriptions on File Prior to Visit  Medication Sig Dispense Refill  . amLODipine (NORVASC) 10 MG tablet TAKE 1 TABLET(10 MG) BY MOUTH DAILY 90 tablet 1  . bisoprolol (ZEBETA) 5 MG tablet TAKE 1 TABLET BY MOUTH DAILY 90 tablet 1  . Blood Glucose Monitoring Suppl (ONE TOUCH ULTRA SYSTEM KIT) W/DEVICE KIT 1 kit by Does not apply route once. Check glucose 1 time daily-DX-E11.2 1 each 0  . glucose blood (ONE TOUCH ULTRA TEST) test strip Check glucose 1 time daily-DX-E11.2 100 each 3  . Lancets (ONETOUCH ULTRASOFT) lancets Check glucose 1 time daily-DX-E11.2 100 each PRN  . quinapril (ACCUPRIL) 40 MG tablet TAKE 1 TABLET(40 MG) BY MOUTH DAILY 90 tablet 0  . TRAVATAN Z 0.004 % SOLN ophthalmic solution Place 1 drop into both eyes at bedtime.     . ALPRAZolam (XANAX) 0.5 MG tablet Take 1/2 to 1 tablet 3 times a day only if needed for anxiety. (Patient not taking: Reported on 02/15/2017) 90 tablet 0   No current  facility-administered medications on file prior to visit.     Current Problems (verified) Patient Active Problem List   Diagnosis Date Noted  . Body mass index (BMI) of 20.0-20.9 in adult 04/22/2015  . Osteopenia 07/03/2014  . Medication management 09/26/2013  . Glaucoma 09/26/2013  . T2_NIDDM   . Hyperlipidemia   . Hypertension   . Vitamin D deficiency   . GERD (gastroesophageal reflux disease)   . Depression, major, in remission ([place])     Screening Tests Immunization History  Administered Date(s) Administered  . Influenza Split 05/01/2013  . Influenza, High Dose Seasonal PF 03/31/2014, 04/22/2015, 05/08/2016  . Pneumococcal Conjugate-13 07/03/2014  . Pneumococcal Polysaccharide-23 08/05/1998  . Td 06/17/2012    Preventative care: Tetanus: 2013  Pneumovax: 2000  Prevnar 13: 2015 Flu vaccine: 2017 Zostavax:  Cost 95 dollars, she  [facility]  Pap: 2010 neg declines another MGM: 03/2016 CATEGORY D DEXA:  2017 Colonoscopy: 2011 due 10 years  EGD: N/A DEE: 04/2013 CXR 10/2013 Cath: 2010 normal EF 60% CT cervical spine 07/2013 CT head 07/2013  Names of Other Physician/Practitioners you currently use: 1. [facility]- here for primary care 2. Dr. Bennye Alm, dentist, last visit June 2017, right eye cat surgery [place] Jan 2018 Dr. Elta Guadel[place] dentist July 2018 Patient Care Team: Unk Pinto, MD as PCP - General (Internal Medicine) Shon Hough, MD as Consulting Physician (Ophthalmology) Lafayette Dragon, MD (Inactive) as Consulting Physician (Gastroenterology) Gean Birchwood, DPM as Consulting Physician (Podiatry)  Allergies Allergies  Allergen Reactions  . Buspirone   . Clonidine Derivatives   . Cymbalta [Duloxetine Hcl]   . Elavil [Amitriptyline]   . Hytrin [Terazosin]   . Nizofenone   . Paxil [Paroxetine Hcl]   . Prozac [Fluoxetine Hcl]   . Zoloft [Sertraline Hcl]    SURGICAL  HISTORY She  has a past surgical history that includes Cholecystectomy and Total abdominal hysterectomy w/ bilateral salpingoophorectomy (1990). FAMILY HISTORY Her family history includes Alzheimer's disease in her father; CVA in her father; Heart attack in her mother; Hypertension in her father and mother; Ulcers in her mother. SOCIAL HISTORY She  reports that she has never smoked. She has never used smokeless tobacco. She reports that she does not drink alcohol or use drugs.  MEDICARE WELLNESS OBJECTIVES: Depression/mood screen:   Depression screen [facility] 2/9 02/15/2017  Decreased Interest 0  Down, Depressed, Hopeless 0  PHQ - 2 Score 0   Hearing: normal Visual acuity: normal,  does perform annual eye exam  ADLs:  In your present state of health, do you have any difficulty performing the following activities: 02/15/2017 11/02/2016  Hearing? N N  Vision? N N  Difficulty concentrating or making decisions? Y N  Walking or climbing stairs? N N  Dressing or bathing? N N  Doing errands, shopping? Y N  Preparing Food and eating ? N -  Using the Toilet? N -  In the past six months, have you accidently leaked urine? N -  Do you have problems with loss of bowel control? N -  Managing your Medications? N -  Managing your Finances? N -  Housekeeping or managing your Housekeeping? N -  Some recent data might be hidden    Cognitive Testing  Alert? Yes  Normal Appearance?Yes  Oriented to person? Yes  Place? Yes   Time? Yes  Recall of three objects?  Yes  Can perform simple calculations? Yes  Displays appropriate judgment?Yes  Can read the correct time from a watch face?Yes  EOL planning: Does Patient Have a Medical Advance Directive?: Yes Type of Advance Directive: [place] will Does patient want to make changes to medical advance directive?: No - Patient declined Copy of [place] in Chart?: Yes  We have copy in epic 04/2016   Objective:    Blood pressure 124/76, pulse 76, temperature (!) 97.3 F (36.3 C), resp. rate 16, height _0  (1.6 m), weight 112 lb 3.2 oz (50.9 kg), SpO2 97 %. Body mass index is 19.88 kg/m.  General appearance: alert, no distress, WD/WN,  female HEENT: normocephalic, sclerae anicteric, TMs pearly, nares patent, no discharge or erythema, pharynx normal Oral cavity: MMM, no lesions Neck: supple, no lymphadenopathy, no thyromegaly, no masses Heart: RRR, normal S1, S2, no murmurs Lungs:  CTA bilaterally, no wheezes, rhonchi, or rales Abdomen: +bs, soft, non tender, non distended, no masses, no hepatomegaly, no splenomegaly Musculoskeletal: nontender, no swelling, no obvious deformity, mild cervical kyphosis Extremities: left leg 1+ edema, mild warmth at ankle with swelling, no pain with palpation, no hard cord, FROM left ankle, no cyanosis, no clubbing Pulses: 2+ symmetric, upper and lower extremities, normal cap refill Neurological: alert, oriented x 3, CN2-12 intact, strength normal upper extremities and lower extremities, sensation normal throughout, DTRs 2+ throughout, no cerebellar signs, gait antalgic with cane Psychiatric: normal affect, behavior normal, pleasant  Breast: defer Gyn: defer Rectal: defer  Medicare Attestation I have personally reviewed: The patient's medical and social history Their use of alcohol, tobacco or illicit drugs Their current medications and supplements The patient's functional ability including ADLs,fall risks, home safety risks, cognitive, and hearing and visual impairment Diet and physical activities Evidence for depression or mood disorders  The patient's weight, height, BMI, and visual acuity have been recorded in the chart.  I have made referrals, counseling, and provided education to the patient based on review of the above and I have provided the patient with a written personalized care plan for preventive services.     Vicie Mutters, PA-C   02/15/2017

## 2017-02-15 ENCOUNTER — Encounter: Payer: Self-pay | Admitting: Physician Assistant

## 2017-02-15 ENCOUNTER — Ambulatory Visit (INDEPENDENT_AMBULATORY_CARE_PROVIDER_SITE_OTHER): Payer: Medicare Other | Admitting: Physician Assistant

## 2017-02-15 VITALS — BP 124/76 | HR 76 | Temp 97.3°F | Resp 16 | Ht 63.0 in | Wt 112.2 lb

## 2017-02-15 DIAGNOSIS — F325 Major depressive disorder, single episode, in full remission: Secondary | ICD-10-CM | POA: Diagnosis not present

## 2017-02-15 DIAGNOSIS — M8580 Other specified disorders of bone density and structure, unspecified site: Secondary | ICD-10-CM

## 2017-02-15 DIAGNOSIS — Z0001 Encounter for general adult medical examination with abnormal findings: Secondary | ICD-10-CM

## 2017-02-15 DIAGNOSIS — E119 Type 2 diabetes mellitus without complications: Secondary | ICD-10-CM

## 2017-02-15 DIAGNOSIS — K219 Gastro-esophageal reflux disease without esophagitis: Secondary | ICD-10-CM | POA: Diagnosis not present

## 2017-02-15 DIAGNOSIS — Z7189 Other specified counseling: Secondary | ICD-10-CM | POA: Diagnosis not present

## 2017-02-15 DIAGNOSIS — I10 Essential (primary) hypertension: Secondary | ICD-10-CM

## 2017-02-15 DIAGNOSIS — R6889 Other general symptoms and signs: Secondary | ICD-10-CM

## 2017-02-15 DIAGNOSIS — R55 Syncope and collapse: Secondary | ICD-10-CM

## 2017-02-15 DIAGNOSIS — M7989 Other specified soft tissue disorders: Secondary | ICD-10-CM

## 2017-02-15 DIAGNOSIS — Z0000 Encounter for general adult medical examination without abnormal findings: Secondary | ICD-10-CM

## 2017-02-15 DIAGNOSIS — R413 Other amnesia: Secondary | ICD-10-CM

## 2017-02-15 DIAGNOSIS — Z79899 Other long term (current) drug therapy: Secondary | ICD-10-CM | POA: Diagnosis not present

## 2017-02-15 DIAGNOSIS — L750 Bromhidrosis: Secondary | ICD-10-CM

## 2017-02-15 DIAGNOSIS — H409 Unspecified glaucoma: Secondary | ICD-10-CM | POA: Diagnosis not present

## 2017-02-15 DIAGNOSIS — E559 Vitamin D deficiency, unspecified: Secondary | ICD-10-CM

## 2017-02-15 DIAGNOSIS — E782 Mixed hyperlipidemia: Secondary | ICD-10-CM | POA: Diagnosis not present

## 2017-02-15 LAB — HEPATIC FUNCTION PANEL
ALBUMIN: 3.9 g/dL (ref 3.6–5.1)
ALT: 10 U/L (ref 6–29)
AST: 14 U/L (ref 10–35)
Alkaline Phosphatase: 80 U/L (ref 33–130)
BILIRUBIN TOTAL: 0.5 mg/dL (ref 0.2–1.2)
Bilirubin, Direct: 0.1 mg/dL (ref <=0.2)
Indirect Bilirubin: 0.4 mg/dL (ref 0.2–1.2)
TOTAL PROTEIN: 6.7 g/dL (ref 6.1–8.1)

## 2017-02-15 LAB — BASIC METABOLIC PANEL WITH GFR
BUN: 21 mg/dL (ref 7–25)
CHLORIDE: 104 mmol/L (ref 98–110)
CO2: 25 mmol/L (ref 20–31)
CREATININE: 0.82 mg/dL (ref 0.60–0.93)
Calcium: 9.3 mg/dL (ref 8.6–10.4)
GFR, Est African American: 81 mL/min (ref >=60)
GFR, Est Non African American: 70 mL/min (ref >=60)
GLUCOSE: 108 mg/dL — AB (ref 65–99)
Potassium: 3.9 mmol/L (ref 3.5–5.3)
Sodium: 140 mmol/L (ref 135–146)

## 2017-02-15 LAB — CBC WITH DIFFERENTIAL/PLATELET
BASOS ABS: 0 {cells}/uL (ref 0–200)
Basophils Relative: 0 %
EOS PCT: 1 %
Eosinophils Absolute: 74 cells/uL (ref 15–500)
HCT: 36.9 % (ref 35.0–45.0)
HEMOGLOBIN: 12.6 g/dL (ref 11.7–15.5)
LYMPHS PCT: 26 %
Lymphs Abs: 1924 cells/uL (ref 850–3900)
MCH: 31.2 pg (ref 27.0–33.0)
MCHC: 34.1 g/dL (ref 32.0–36.0)
MCV: 91.3 fL (ref 80.0–100.0)
MONOS PCT: 7 %
MPV: 9.6 fL (ref 7.5–12.5)
Monocytes Absolute: 518 cells/uL (ref 200–950)
NEUTROS PCT: 66 %
Neutro Abs: 4884 cells/uL (ref 1500–7800)
Platelets: 306 10*3/uL (ref 140–400)
RBC: 4.04 MIL/uL (ref 3.80–5.10)
RDW: 12.9 % (ref 11.0–15.0)
WBC: 7.4 10*3/uL (ref 3.8–10.8)

## 2017-02-15 LAB — TSH: TSH: 0.6 m[IU]/L

## 2017-02-15 MED ORDER — METFORMIN HCL ER 500 MG PO TB24: ORAL_TABLET | ORAL | 1 refills | Status: AC

## 2017-02-15 NOTE — Patient Instructions (Addendum)
Do not drive we figure out what is going on Monitor BP and HR Omron BP cuff With syncopal episode going to get MRI  Any worsening symptoms go to ER  New guidelines suggest the benzodiazepines are best short term, with prolonged use they lead to physical and psychological dependence. In addition, evidence suggest that for insomnia the effectiveness wanes in 4 weeks and the risks out weight their benefits. Use of these agents have been associated with dementia, falls, motor vehicle accidents and physical addiction. Decreasing these medication have been proven to show improvements in cognition, alertness, decrease of falls and daytime sedation.   We will start a slow taper, symptoms of withdrawal include, insomnia, anxiety, irritability, sweating and stomach or intestinal symptoms like diarrhea or nausea.    Peripheral Edema Peripheral edema is swelling that is caused by a buildup of fluid. Peripheral edema most often affects the lower legs, ankles, and feet. It can also develop in the arms, hands, and face. The area of the body that has peripheral edema will look swollen. It may also feel heavy or warm. Your clothes may start to feel tight. Pressing on the area may make a temporary dent in your skin. You may not be able to move your arm or leg as much as usual. There are many causes of peripheral edema. It can be a complication of other diseases, such as congestive heart failure, kidney disease, or a problem with your blood circulation. It also can be a side effect of certain medicines. It often happens to women during pregnancy. Sometimes, the cause is not known. Treating the underlying condition is often the only treatment for peripheral edema. Follow these instructions at home: Pay attention to any changes in your symptoms. Take these actions to help with your discomfort:  Raise (elevate) your legs while you are sitting or lying down.  Move around often to prevent stiffness and to lessen  swelling. Do not sit or stand for long periods of time.  Wear support stockings as told by your health care provider.  Follow instructions from your health care provider about limiting salt (sodium) in your diet. Sometimes eating less salt can reduce swelling.  Take over-the-counter and prescription medicines only as told by your health care provider. Your health care provider may prescribe medicine to help your body get rid of excess water (diuretic).  Keep all follow-up visits as told by your health care provider. This is important.  Contact a health care provider if:  You have a fever.  Your edema starts suddenly or is getting worse, especially if you are pregnant or have a medical condition.  You have swelling in only one leg.  You have increased swelling and pain in your legs. Get help right away if:  You develop shortness of breath, especially when you are lying down.  You have pain in your chest or abdomen.  You feel weak.  You faint. This information is not intended to replace advice given to you by your health care provider. Make sure you discuss any questions you have with your health care provider. Document Released: 08/10/2004 Document Revised: 12/06/2015 Document Reviewed: 01/13/2015 Elsevier Interactive Patient Education  [employer].   Vascular Dementia Dementia is a condition in which a person has problems with thinking, memory, and behavior that are severe enough to interfere with daily life. Vascular dementia is a type of dementia. It results from brain damage that is caused by the brain not getting enough blood. Vascular dementia usually  begins between 60 and 75 years of age. 24What are the causes? Vascular dementia is caused by conditions that lessen blood flow to the brain. Common causes include:  Multiple small strokes. These may happen without symptoms (silent stroke).  Major stroke.  Damage to small blood vessels in the brain (cerebral small  vessel disease).  What increases the risk?  Advancing age.  Having had a stroke.  Having high blood pressure (hypertension) or high cholesterol.  Having a disease that affects the heart or blood vessels.  Smoking.  Having diabetes.  Being female.  Being obese.  Not being active.  Having depression. What are the signs or symptoms? Symptoms can vary a lot from one person to another. Symptoms may be mild or severe depending on the amount of damage and which parts of the brain have been affected. Symptoms may begin suddenly or may develop gradually. Symptoms may remain stable, or they may get worse over time. Symptoms of vascular dementia may be similar to those of Alzheimer disease. The two conditions can occur together (mixed dementia). Symptoms of vascular dementia may include: Mental  Confusion.  Memory problems.  Poor attention and concentration.  Trouble understanding speech.  Depression.  Personality changes.  Trouble recognizing familiar people.  Agitation or aggression.  Paranoia.  Delusions or hallucinations. Physical  Weakness.  Poor balance.  Loss of bladder or bowel control (incontinence).  Unsteady walking (gait).  Speaking problems. Behavioral  Getting lost in familiar places.  Problems with planning and judgment.  Trouble following instructions.  Social problems.  Emotional outbursts.  Trouble with daily activities and self-care.  Problems handling money. How is this diagnosed? There is not a specific test to diagnose vascular dementia. The health care provider will consider the person's medical history and symptoms or changes that are reported by friends and family. The health care provider will do a physical exam and may order lab tests or other tests that check brain and nervous system function. Tests that may be done include:  Blood tests.  Brain imaging tests.  Tests of movement, speech, and other daily activities  (neurological exam).  Tests of memory, thinking, and problem-solving (neuropsychological or neurocognitive testing).  Diagnosis may involve several specialists. These may include a health care provider who specializes in the brain and nervous system (neurologist), a provider who specializes in disorders of the mind (psychiatrist), and a provider who focuses on speech and language changes (Doctor, general practicespeech pathologist). How is this treated? There is no cure for vascular dementia. Brain damage that has already occurred cannot be reversed. Treatment depends on:  How severe the condition is.  Which parts of the brain have been affected.  The person's overall health.  Treatment measures aim to:  Treat the underlying cause of vascular dementia and manage risk factors. This may include: ? Controlling blood pressure. ? Lowering cholesterol. ? Treating diabetes. ? Quitting smoking. ? Losing weight.  Manage symptoms.  Prevent further brain damage.  Improve the person's health and quality of life.  Treatment for dementia may involve a team of health care providers, including:  A neurologist.  A psychiatrist.  An occupational therapist.  A speech pathologist.  A cardiologist.  An exercise physiologist or physical therapist.  Follow these instructions at home: Home care for a person with vascular dementia depends on what caused the condition and how severe the symptoms are. General guidelines for care at home include:  Following the health care provider's instructions for treating the condition that caused the dementia.  Using medicines only as told by the person's health care provider.  Creating a safe living space.  Learning ways to help the person remember people, appointments, and daily activities.  Finding a support group to help caregivers and family to cope with the effects of dementia.  Helping family and friends learn about ways to communicate with a person who has  dementia.  Making sure the person keeps all follow-up visits and goes to all rehabilitation appointments as told by the health care team. This is important.  Contact a health care provider if:  A fever develops.  New behavioral problems develop.  Problems with swallowing develop.  Confusion gets worse.  Sleepiness gets worse. Get help right away if:  Loss of consciousness occurs.  There is a sudden loss of speech, balance, or thinking ability.  New numbness or paralysis occurs.  Sudden, severe headache occurs.  Vision is lost or suddenly gets worse in one or both eyes. This information is not intended to replace advice given to you by your health care provider. Make sure you discuss any questions you have with your health care provider. Document Released: 06/23/2002 Document Revised: 12/09/2015 Document Reviewed: 10/14/2014 Elsevier Interactive Patient Education  2018 [employer]

## 2017-02-16 LAB — URIC ACID: Uric Acid, Serum: 5.1 mg/dL (ref 2.5–7.0)

## 2017-02-16 LAB — URINALYSIS, ROUTINE W REFLEX MICROSCOPIC
BILIRUBIN URINE: NEGATIVE
GLUCOSE, UA: NEGATIVE
Hgb urine dipstick: NEGATIVE
Ketones, ur: NEGATIVE
Nitrite: NEGATIVE
PH: 6.5 (ref 5.0–8.0)
Protein, ur: NEGATIVE
SPECIFIC GRAVITY, URINE: 1.007 (ref 1.001–1.035)

## 2017-02-16 LAB — URINALYSIS, MICROSCOPIC ONLY
BACTERIA UA: NONE SEEN [HPF]
CASTS: NONE SEEN [LPF]
CRYSTALS: NONE SEEN [HPF]
RBC / HPF: NONE SEEN RBC/HPF (ref <=2)
Squamous Epithelial / LPF: NONE SEEN [HPF] (ref <=5)
YEAST: NONE SEEN [HPF]

## 2017-02-16 LAB — URINE CULTURE: ORGANISM ID, BACTERIA: NO GROWTH

## 2017-02-19 ENCOUNTER — Encounter: Payer: Self-pay | Admitting: Internal Medicine

## 2017-02-19 NOTE — Progress Notes (Signed)
LVM for pt to return office call for LAB results.

## 2017-02-20 ENCOUNTER — Ambulatory Visit: Payer: Self-pay | Admitting: Physician Assistant

## 2017-02-21 ENCOUNTER — Ambulatory Visit (HOSPITAL_COMMUNITY)
Admission: RE | Admit: 2017-02-21 | Discharge: 2017-02-21 | Disposition: A | Discharge status: Home / Self Care | Payer: Medicare Other | Source: Ambulatory Visit | Attending: Physician Assistant | Admitting: Physician Assistant

## 2017-02-21 DIAGNOSIS — M7989 Other specified soft tissue disorders: Secondary | ICD-10-CM | POA: Insufficient documentation

## 2017-02-21 DIAGNOSIS — R590 Localized enlarged lymph nodes: Secondary | ICD-10-CM | POA: Diagnosis not present

## 2017-02-21 NOTE — Progress Notes (Signed)
*  PRELIMINARY RESULTS* Vascular Ultrasound Left lower extremity venous duplex has been completed.  Preliminary findings: No evidence of DVT or baker's cyst.   Attempted call report to Quentin MullingAmanda Collier with no answer. Will let patient leave.   Farrel DemarkJill Eunice, RDMS, RVT  02/21/2017, 9:07 AM

## 2017-02-22 NOTE — Progress Notes (Signed)
Pt aware of lab results & voiced understanding of those results. Pt was transferred to front office for a F/UP on enlarged lymph nodes.

## 2017-02-25 NOTE — Progress Notes (Signed)
Subjective:    Patient ID: Melissa Davis, female    DOB: [birth year], 76 y.o.   MRN: [id number]  HPI 76 y.o. WF presents for evaluation for enlarged lymph nodes on [place].  She was seen recently for possible syncopal episode and left leg swelling, she had a left leg [place] that showed bilateral lymph node swelling. She had a normal CBC last visit.  No night sweat, 4 lbs weight loss. No decreased appetite but will nibble on kit kats,  Wt Readings from Last 4 Encounters:  02/26/17 114 lb 3.2 oz (51.8 kg)  02/15/17 112 lb 3.2 oz (50.9 kg)  11/02/16 118 lb 9.6 oz (53.8 kg)  07/28/16 118 lb 9.6 oz (53.8 kg)    Blood pressure 140/62, pulse 78, temperature (!) 97 F (36.1 C), height 5' 3"  (1.6 m), weight 114 lb 3.2 oz (51.8 kg), SpO2 99 %.  Medications Current Outpatient Prescriptions on File Prior to Visit  Medication Sig  . ALPRAZolam (XANAX) 0.5 MG tablet Take 1/2 to 1 tablet 3 times a day only if needed for anxiety.  Marland Kitchen amLODipine (NORVASC) 10 MG tablet TAKE 1 TABLET(10 MG) BY MOUTH DAILY  . bisoprolol (ZEBETA) 5 MG tablet TAKE 1 TABLET BY MOUTH DAILY  . Blood Glucose Monitoring Suppl (ONE TOUCH ULTRA SYSTEM KIT) W/DEVICE KIT 1 kit by Does not apply route once. Check glucose 1 time daily-DX-E11.2  . glucose blood (ONE TOUCH ULTRA TEST) test strip Check glucose 1 time daily-DX-E11.2  . Lancets (ONETOUCH ULTRASOFT) lancets Check glucose 1 time daily-DX-E11.2  . metFORMIN (GLUCOPHAGE-XR) 500 MG 24 hr tablet TAKE 1 TABLET BY MOUTH DAILY OR AS DIRECTED FOR DIABETES  . quinapril (ACCUPRIL) 40 MG tablet TAKE 1 TABLET(40 MG) BY MOUTH DAILY  . TRAVATAN Z 0.004 % SOLN ophthalmic solution Place 1 drop into both eyes at bedtime.    No current facility-administered medications on file prior to visit.     Problem list She has T2_NIDDM; Hyperlipidemia; Hypertension; Vitamin D deficiency; GERD (gastroesophageal reflux disease); Depression, major, in remission ([place]); Medication management; Glaucoma;  Osteopenia; and Body mass index (BMI) of 20.0-20.9 in adult on her problem list.   Review of Systems  Constitutional: Negative.   HENT: Negative.   Respiratory: Negative.   Cardiovascular: Positive for leg swelling. Negative for chest pain and palpitations.  Gastrointestinal: Positive for abdominal distention. Negative for abdominal pain, anal bleeding, blood in stool, constipation, diarrhea, nausea, rectal pain and vomiting.  Genitourinary: Negative.   Musculoskeletal: Negative.   Skin: Negative.   Neurological: Negative.   Hematological: Negative.   Psychiatric/Behavioral: Negative.        Objective:   Physical Exam General appearance: alert, no distress, WD/WN,  female HEENT: normocephalic, sclerae anicteric, TMs pearly, nares patent, no discharge or erythema, pharynx normal Oral cavity: MMM, no lesions Neck: supple, no lymphadenopathy, no thyromegaly, no masses Heart: RRR, normal S1, S2, no murmurs Lungs: CTA bilaterally, no wheezes, rhonchi, or rales Abdomen: +bs, soft, non tender, non distended, no masses, no hepatomegaly, no splenomegaly Musculoskeletal: nontender, no swelling, no obvious deformity, mild cervical kyphosis Extremities: left leg 1+ edema, mild warmth at ankle with swelling, no pain with palpation, no hard cord, FROM left ankle, no cyanosis, no clubbing Pulses: 2+ symmetric, upper and lower extremities, normal cap refill Neurological: alert, oriented x 3, CN2-12 intact, strength normal upper extremities and lower extremities, sensation normal throughout, DTRs 2+ throughout, no cerebellar signs, gait antalgic with cane Psychiatric: normal affect, behavior normal, pleasant  Lymph: right  inguinal lymphadenopathy, bilateral, right worse than left     Assessment & Plan:   Lymphadenopathy on [place] of legs with possible left leg lymphedema AB distension, no pain -     DG Chest 2 View; Future -     CT Abdomen Pelvis Wo Contrast; Future - rule out  lymphoma/cancer    Future Appointments Date Time Provider [place]  02/27/2017 7:50 AM GI-315 MR 3 [id number] [facility]  03/15/2017 2:20 PM Minus Breeding, MD [facility]  04/02/2017 9:45 AM Gean Birchwood, DPM [facility]  05/24/2017 9:30 AM Unk Pinto, MD [facility] None  08/01/2017 9:00 AM Vicie Mutters, PA-C [facility] None

## 2017-02-26 ENCOUNTER — Ambulatory Visit (INDEPENDENT_AMBULATORY_CARE_PROVIDER_SITE_OTHER): Payer: Medicare Other | Admitting: Physician Assistant

## 2017-02-26 ENCOUNTER — Encounter: Payer: Self-pay | Admitting: Cardiology

## 2017-02-26 VITALS — BP 140/62 | HR 78 | Temp 97.0°F | Ht 63.0 in | Wt 114.2 lb

## 2017-02-26 DIAGNOSIS — R591 Generalized enlarged lymph nodes: Secondary | ICD-10-CM | POA: Diagnosis not present

## 2017-02-26 DIAGNOSIS — R140 Abdominal distension (gaseous): Secondary | ICD-10-CM | POA: Diagnosis not present

## 2017-02-26 NOTE — Patient Instructions (Signed)
Increase protein Will get chest xray and CT AB and pelvis   Peripheral Edema Peripheral edema is swelling that is caused by a buildup of fluid. Peripheral edema most often affects the lower legs, ankles, and feet. It can also develop in the arms, hands, and face. The area of the body that has peripheral edema will look swollen. It may also feel heavy or warm. Your clothes may start to feel tight. Pressing on the area may make a temporary dent in your skin. You may not be able to move your arm or leg as much as usual. There are many causes of peripheral edema. It can be a complication of other diseases, such as congestive heart failure, kidney disease, or a problem with your blood circulation. It also can be a side effect of certain medicines. It often happens to women during pregnancy. Sometimes, the cause is not known. Treating the underlying condition is often the only treatment for peripheral edema. Follow these instructions at home: Pay attention to any changes in your symptoms. Take these actions to help with your discomfort:  Raise (elevate) your legs while you are sitting or lying down.  Move around often to prevent stiffness and to lessen swelling. Do not sit or stand for long periods of time.  Wear support stockings as told by your health care provider.  Follow instructions from your health care provider about limiting salt (sodium) in your diet. Sometimes eating less salt can reduce swelling.  Take over-the-counter and prescription medicines only as told by your health care provider. Your health care provider may prescribe medicine to help your body get rid of excess water (diuretic).  Keep all follow-up visits as told by your health care provider. This is important.  Contact a health care provider if:  You have a fever.  Your edema starts suddenly or is getting worse, especially if you are pregnant or have a medical condition.  You have swelling in only one leg.  You have  increased swelling and pain in your legs. Get help right away if:  You develop shortness of breath, especially when you are lying down.  You have pain in your chest or abdomen.  You feel weak.  You faint. This information is not intended to replace advice given to you by your health care provider. Make sure you discuss any questions you have with your health care provider. Document Released: 08/10/2004 Document Revised: 12/06/2015 Document Reviewed: 01/13/2015 Elsevier Interactive Patient Education  2018 [employer].   Lymphadenopathy Lymphadenopathy refers to swollen or enlarged lymph glands, also called lymph nodes. Lymph glands are part of your body's defense (immune) system, which protects the body from infections, germs, and diseases. Lymph glands are found in many locations in your body, including the neck, underarm, and groin. Many things can cause lymph glands to become enlarged. When your immune system responds to germs, such as viruses or bacteria, infection-fighting cells and fluid build up. This causes the glands to grow in size. Usually, this is not something to worry about. The swelling and any soreness often go away without treatment. However, swollen lymph glands can also be caused by a number of diseases. Your health care provider may do various tests to help determine the cause. If the cause of your swollen lymph glands cannot be found, it is important to monitor your condition to make sure the swelling goes away. Follow these instructions at home: Watch your condition for any changes. The following actions may help to lessen any  discomfort you are feeling:  Get plenty of rest.  Take medicines only as directed by your health care provider. Your health care provider may recommend over-the-counter medicines for pain.  Apply moist heat compresses to the site of swollen lymph nodes as directed by your health care provider. This can help reduce any pain.  Check your lymph  nodes daily for any changes.  Keep all follow-up visits as directed by your health care provider. This is important.  Contact a health care provider if:  Your lymph nodes are still swollen after 2 weeks.  Your swelling increases or spreads to other areas.  Your lymph nodes are hard, seem fixed to the skin, or are growing rapidly.  Your skin over the lymph nodes is red and inflamed.  You have a fever.  You have chills.  You have fatigue.  You develop a sore throat.  You have abdominal pain.  You have weight loss.  You have night sweats. Get help right away if:  You notice fluid leaking from the area of the enlarged lymph node.  You have severe pain in any area of your body.  You have chest pain.  You have shortness of breath. This information is not intended to replace advice given to you by your health care provider. Make sure you discuss any questions you have with your health care provider. Document Released: 04/11/2008 Document Revised: 12/09/2015 Document Reviewed: 02/05/2014 Elsevier Interactive Patient Education  [employer]2018 [employer] Inc.

## 2017-02-27 ENCOUNTER — Ambulatory Visit
Admission: RE | Admit: 2017-02-27 | Discharge: 2017-02-27 | Disposition: A | Discharge status: Home / Self Care | Payer: Medicare Other | Source: Ambulatory Visit | Attending: Physician Assistant | Admitting: Physician Assistant

## 2017-02-27 ENCOUNTER — Encounter: Payer: Self-pay | Admitting: Internal Medicine

## 2017-02-27 ENCOUNTER — Encounter: Payer: Self-pay | Admitting: Physician Assistant

## 2017-02-27 DIAGNOSIS — R55 Syncope and collapse: Secondary | ICD-10-CM

## 2017-02-27 DIAGNOSIS — I700 Atherosclerosis of aorta: Secondary | ICD-10-CM | POA: Insufficient documentation

## 2017-02-27 DIAGNOSIS — R591 Generalized enlarged lymph nodes: Secondary | ICD-10-CM

## 2017-02-27 DIAGNOSIS — R413 Other amnesia: Secondary | ICD-10-CM

## 2017-02-27 NOTE — Progress Notes (Signed)
Pt's caregiver & pt is aware of lab results & voiced understanding of those results.

## 2017-03-01 ENCOUNTER — Ambulatory Visit
Admission: RE | Admit: 2017-03-01 | Discharge: 2017-03-01 | Disposition: A | Discharge status: Home / Self Care | Payer: Medicare Other | Source: Ambulatory Visit | Attending: Physician Assistant | Admitting: Physician Assistant

## 2017-03-01 DIAGNOSIS — R140 Abdominal distension (gaseous): Secondary | ICD-10-CM

## 2017-03-01 MED ORDER — IOPAMIDOL (ISOVUE-300) INJECTION 61%
100.0000 mL | Freq: Once | INTRAVENOUS | Status: AC | PRN
  Administered 2017-03-01: 100 mL via INTRAVENOUS

## 2017-03-01 NOTE — Progress Notes (Signed)
Pt aware of lab results & voiced understanding of those results.

## 2017-03-14 NOTE — Progress Notes (Signed)
Cardiology Office Note   Date:  03/16/2017   ID:  FRUMA AFRICA, DOB 08-May-1941, MRN [id number]  PCP:  Unk Pinto, MD  Cardiologist:   Minus Breeding, MD  Referring:  Unk Pinto, MD  Chief Complaint  Patient presents with  . Loss of Consciousness      History of Present Illness: Melissa Davis is a 76 y.o. female who is referred by  Unk Pinto, MD for evaluation of syncope.  She had an episode where she went down in her house on July 31. Her granddaughter was living with her. She had to get up and take her to the emergency room because her child had injured him/herself.  She got back home by herself. She found herself on the floor in her kitchen but she doesn't remember any of the events. She's not sure whether she tripped. Her ankle was sore.  She had bumped her head. However, she felt well when she came to. She did go to the emergency room. She's not had any palpitations. She doesn't have orthostatic symptoms. She denies any chest pressure, neck or arm discomfort. She's very active. She did have an MRI which demonstrated some age-related changes but nothing acute. She had a CT of her abdomen and had some aortic atherosclerosis. This was apparently to evaluate possible lymphadenopathy. She has some lower extremity swelling and had a lower extremity Doppler which demonstrated no DVT. Labs have been unremarkable. TSH is been normal.   Electrolytes  electrolytes and negative. She never had syncope before or since then. She's otherwise felt well.   EKG has been unremarkable.  Past Medical History:  Diagnosis Date  . Depression   . GERD (gastroesophageal reflux disease)   . Hyperlipidemia   . Hypertension   . Type II or unspecified type diabetes mellitus without mention of complication, not stated as uncontrolled   . Vitamin D deficiency     Past Surgical History:  Procedure Laterality Date  . CHOLECYSTECTOMY    . TOTAL ABDOMINAL HYSTERECTOMY W/ BILATERAL  SALPINGOOPHORECTOMY  1990     Current Outpatient Prescriptions  Medication Sig Dispense Refill  . ALPRAZolam (XANAX) 0.5 MG tablet Take 1/2 to 1 tablet 3 times a day only if needed for anxiety. 90 tablet 0  . amLODipine (NORVASC) 10 MG tablet TAKE 1 TABLET(10 MG) BY MOUTH DAILY 90 tablet 1  . bisoprolol (ZEBETA) 5 MG tablet TAKE 1 TABLET BY MOUTH DAILY 90 tablet 1  . Blood Glucose Monitoring Suppl (ONE TOUCH ULTRA SYSTEM KIT) W/DEVICE KIT 1 kit by Does not apply route once. Check glucose 1 time daily-DX-E11.2 1 each 0  . glucose blood (ONE TOUCH ULTRA TEST) test strip Check glucose 1 time daily-DX-E11.2 100 each 3  . Lancets (ONETOUCH ULTRASOFT) lancets Check glucose 1 time daily-DX-E11.2 100 each PRN  . metFORMIN (GLUCOPHAGE-XR) 500 MG 24 hr tablet TAKE 1 TABLET BY MOUTH DAILY OR AS DIRECTED FOR DIABETES 90 tablet 1  . quinapril (ACCUPRIL) 40 MG tablet TAKE 1 TABLET(40 MG) BY MOUTH DAILY 90 tablet 0  . TRAVATAN Z 0.004 % SOLN ophthalmic solution Place 1 drop into both eyes at bedtime.      No current facility-administered medications for this visit.     Allergies:   Buspirone; Clonidine derivatives; Cymbalta [duloxetine hcl]; Elavil [amitriptyline]; Hytrin [terazosin]; Nizofenone; Paxil [paroxetine hcl]; Prozac [fluoxetine hcl]; and Zoloft [sertraline hcl]    Social History:  The patient  reports that she has never smoked. She has never used  smokeless tobacco. She reports that she does not drink alcohol or use drugs.   Family History:  The patient's family history includes Alzheimer's disease in her father; CVA in her father; Heart attack in her mother; Hypertension in her father and mother; Ulcers in her mother.    ROS:  Please see the history of present illness.   Otherwise, review of systems are positive for none.   All other systems are reviewed and negative.    PHYSICAL EXAM: VS:  BP (!) 130/50 Comment: Left arm.  Pulse 64   Ht 5' 3"  (1.6 m)   Wt 117 lb (53.1 kg)   BMI 20.73  kg/m  , BMI Body mass index is 20.73 kg/m. GENERAL:  Well appearing HEENT:  Pupils equal round and reactive, fundi not visualized, oral mucosa unremarkable NECK:  No jugular venous distention, waveform within normal limits, carotid upstroke brisk and symmetric, no bruits, no thyromegaly LYMPHATICS:  No cervical, inguinal adenopathy LUNGS:  Clear to auscultation bilaterally BACK:  No CVA tenderness CHEST:  Unremarkable HEART:  PMI not displaced or sustained,S1 and S2 within normal limits, no S3, no S4, no clicks, no rubs, no murmurs ABD:  Flat, positive bowel sounds normal in frequency in pitch, no bruits, no rebound, no guarding, no midline pulsatile mass, no hepatomegaly, no splenomegaly EXT:  2 plus pulses throughout, left leg edema, no cyanosis no clubbing SKIN:  No rashes no nodules NEURO:  Cranial nerves II through XII grossly intact, motor grossly intact throughout PSYCH:  Cognitively intact, oriented to person place and time    EKG:  EKG is not ordered today. The ekg ordered 02/15/17 normal dyspnea  demonstrates NSR, rate 60 , axis WNL., intervals WNL, no acute ST T wave changes.     Recent Labs: 11/02/2016: Magnesium 2.0 02/15/2017: ALT 10; BUN 21; Creat 0.82; Hemoglobin 12.6; Platelets 306; Potassium 3.9; Sodium 140; TSH 0.60    Lipid Panel    Component Value Date/Time   CHOL 196 11/02/2016 1201   TRIG 124 11/02/2016 1201   HDL 62 11/02/2016 1201   CHOLHDL 3.2 11/02/2016 1201   VLDL 25 11/02/2016 1201   LDLCALC 109 (H) 11/02/2016 1201      Wt Readings from Last 3 Encounters:  03/15/17 117 lb (53.1 kg)  02/26/17 114 lb 3.2 oz (51.8 kg)  02/15/17 112 lb 3.2 oz (50.9 kg)      Other studies Reviewed: Additional studies/ records that were reviewed today include: MRI, office records. Review of the above records demonstrates:  Please see elsewhere in the note.     ASSESSMENT AND PLAN:  QUESTIONABLE SYNCOPE:  It is not entirely clear that the patient had syncope.  Regardless she has a normal exam. EKG is normal. She has no ongoing symptoms. He is never had an episode before or since. Orthostatics were normal. Given this the workup would be low yield. No further testing is suggested but she should let me know should she have any presyncope or syncope going forward.   AORTIC ATHEROSCLEROSIS:  This was noted in incidentally. She needs continued risk reduction. No further cardiac testing is suggested.    Current medicines are reviewed at length with the patient today.  The patient does not have concerns regarding medicines.  The following changes have been made:  no change  Labs/ tests ordered today include: None No orders of the defined types were placed in this encounter.    Disposition:   FU with as needed.  Signed, Minus Breeding, MD  03/16/2017 8:53 AM    [facility]

## 2017-03-15 ENCOUNTER — Encounter: Payer: Self-pay | Admitting: Cardiology

## 2017-03-15 ENCOUNTER — Ambulatory Visit (INDEPENDENT_AMBULATORY_CARE_PROVIDER_SITE_OTHER): Payer: Medicare Other | Admitting: Cardiology

## 2017-03-15 VITALS — BP 130/50 | HR 64 | Ht 63.0 in | Wt 117.0 lb

## 2017-03-15 DIAGNOSIS — I700 Atherosclerosis of aorta: Secondary | ICD-10-CM

## 2017-03-15 DIAGNOSIS — R55 Syncope and collapse: Secondary | ICD-10-CM

## 2017-03-15 NOTE — Patient Instructions (Signed)
Your physician recommends that you schedule a follow-up appointment in: As Needed    

## 2017-03-16 ENCOUNTER — Encounter: Payer: Self-pay | Admitting: Cardiology

## 2017-03-16 DIAGNOSIS — R55 Syncope and collapse: Secondary | ICD-10-CM | POA: Insufficient documentation

## 2017-03-26 ENCOUNTER — Telehealth: Payer: Self-pay | Admitting: *Deleted

## 2017-03-26 NOTE — Telephone Encounter (Signed)
Patients daughter call Clydie BraunKaren asking when should patient start driving said you had told her to not drive till all Dr's appointments were done. Marchelle Folksmanda said for patient to wait till her Nov appointment & we will determine at that point if & when she can start driving. sb 03/26/17

## 2017-04-02 ENCOUNTER — Ambulatory Visit: Payer: Medicare Other | Admitting: Podiatry

## 2017-04-03 ENCOUNTER — Other Ambulatory Visit: Payer: Self-pay | Admitting: Internal Medicine

## 2017-04-03 DIAGNOSIS — I10 Essential (primary) hypertension: Secondary | ICD-10-CM

## 2017-04-04 ENCOUNTER — Ambulatory Visit: Payer: Medicare Other | Admitting: Podiatry

## 2017-04-17 ENCOUNTER — Ambulatory Visit (INDEPENDENT_AMBULATORY_CARE_PROVIDER_SITE_OTHER): Payer: Medicare Other | Admitting: Podiatry

## 2017-04-17 ENCOUNTER — Encounter: Payer: Self-pay | Admitting: Podiatry

## 2017-04-17 DIAGNOSIS — B351 Tinea unguium: Secondary | ICD-10-CM | POA: Diagnosis not present

## 2017-04-17 DIAGNOSIS — E119 Type 2 diabetes mellitus without complications: Secondary | ICD-10-CM | POA: Diagnosis not present

## 2017-04-17 DIAGNOSIS — M79676 Pain in unspecified toe(s): Secondary | ICD-10-CM | POA: Diagnosis not present

## 2017-04-17 NOTE — Patient Instructions (Signed)
Removed Band-Aid on third right toe in 1-3 days and continue to apply topical antibiotic ointment and a Band-Aid daily until a scab forms  Diabetes and Foot Care Diabetes may cause you to have problems because of poor blood supply (circulation) to your feet and legs. This may cause the skin on your feet to become thinner, break easier, and heal more slowly. Your skin may become dry, and the skin may peel and crack. You may also have nerve damage in your legs and feet causing decreased feeling in them. You may not notice minor injuries to your feet that could lead to infections or more serious problems. Taking care of your feet is one of the most important things you can do for yourself. Follow these instructions at home:  Wear shoes at all times, even in the house. Do not go barefoot. Bare feet are easily injured.  Check your feet daily for blisters, cuts, and redness. If you cannot see the bottom of your feet, use a mirror or ask someone for help.  Wash your feet with warm water (do not use hot water) and mild soap. Then pat your feet and the areas between your toes until they are completely dry. Do not soak your feet as this can dry your skin.  Apply a moisturizing lotion or petroleum jelly (that does not contain alcohol and is unscented) to the skin on your feet and to dry, brittle toenails. Do not apply lotion between your toes.  Trim your toenails straight across. Do not dig under them or around the cuticle. File the edges of your nails with an emery board or nail file.  Do not cut corns or calluses or try to remove them with medicine.  Wear clean socks or stockings every day. Make sure they are not too tight. Do not wear knee-high stockings since they may decrease blood flow to your legs.  Wear shoes that fit properly and have enough cushioning. To break in new shoes, wear them for just a few hours a day. This prevents you from injuring your feet. Always look in your shoes before you put  them on to be sure there are no objects inside.  Do not cross your legs. This may decrease the blood flow to your feet.  If you find a minor scrape, cut, or break in the skin on your feet, keep it and the skin around it clean and dry. These areas may be cleansed with mild soap and water. Do not cleanse the area with peroxide, alcohol, or iodine.  When you remove an adhesive bandage, be sure not to damage the skin around it.  If you have a wound, look at it several times a day to make sure it is healing.  Do not use heating pads or hot water bottles. They may burn your skin. If you have lost feeling in your feet or legs, you may not know it is happening until it is too late.  Make sure your health care provider performs a complete foot exam at least annually or more often if you have foot problems. Report any cuts, sores, or bruises to your health care provider immediately. Contact a health care provider if:  You have an injury that is not healing.  You have cuts or breaks in the skin.  You have an ingrown nail.  You notice redness on your legs or feet.  You feel burning or tingling in your legs or feet.  You have pain or cramps in your  legs and feet.  Your legs or feet are numb.  Your feet always feel cold. Get help right away if:  There is increasing redness, swelling, or pain in or around a wound.  There is a red line that goes up your leg.  Pus is coming from a wound.  You develop a fever or as directed by your health care provider.  You notice a bad smell coming from an ulcer or wound. This information is not intended to replace advice given to you by your health care provider. Make sure you discuss any questions you have with your health care provider. Document Released: 06/30/2000 Document Revised: 12/09/2015 Document Reviewed: 12/10/2012 Elsevier Interactive Patient Education  2017 [employer].

## 2017-04-17 NOTE — Progress Notes (Signed)
Patient ID: Melissa Davis, female   DOB: 1941-04-16, 76 y.o.   MRN: [id number]    Subjective: This patient presents today for scheduled visit complaining of painful toenails walking wearing shoes and request toenail debridement  Objective: Orientated 3 DP and PT pulses 2/4 bilaterally Capillary reflex immediate bilaterally Sensation to 10 g monofilament wire intact 4/5 bilaterally Vibratory sensation reactive bilaterally Ankle reflex equal and reactive bilaterally HAV left Hammertoe second left No open skin lesions bilaterally Atrophic dry skin bilaterally Absent hair growth bilaterally The toenails are brittle, elongated, discolored, incurvated, hypertrophic and tender to direct palpation 6-10 Corns distal third right toe   Assessment: Symptomatic onychomycoses 6-10 Type II diabetic without complications Corns 2  Plan: Debridement of toenails mechanically and electrically 10 with slight bleeding distal third right toe. Apply topical antibiotic ointment and Band-Aid. Patient instructed removed Band-Aid 1-3 days and continue applying topical antibiotic ointment and Band-Aid until a scab forms  Debrided corn 1 without any bleeding  Reappoint 3 months

## 2017-04-20 ENCOUNTER — Other Ambulatory Visit: Payer: Self-pay | Admitting: Internal Medicine

## 2017-04-24 ENCOUNTER — Telehealth: Payer: Self-pay

## 2017-04-24 NOTE — Telephone Encounter (Signed)
-----   Message from Quentin Mulling, [place] sent at 04/24/2017 12:14 PM EDT ----- Regarding: RE: CONCERNS We can try to get someone to do her medications for her, had the normal MRI, can refer to neuro if she wants. She would have many other symptoms if it was brain fluid and had the normal MRI very recently, likely it is allergies which it has been very bad lately. Make sure she is on a claritin. If you are very concerned make an appointment here (can be with any provider).  Marchelle Folks ----- Message ----- From: Gregery Na, CMA Sent: 04/24/2017  11:20 AM To: Quentin Mulling, PA-C Subject: CONCERNS                                       Pt dgtr called to report that her mother doubled up on her meds Monday & would like to know if her mother should take her meds today due to her doubling up, pt's dgtr stated that this has happened before as well. Clydie Braun pt's dgtr states that her mother has become more confused, mixing up dates,time,& meds now so she feels that her mother should not be driving as well. Something she has talked to you about before. Karen(dgtr) also states that her mother's nose is always running & is concern that it could be brain fluid from when she hit her head before or she may need a med change because of the runny nose.  Please advise.

## 2017-04-24 NOTE — Telephone Encounter (Signed)
Pt's dgtr reports she will just keep an eye on her mother & will get someone to help her mother take her meds on the days her mother is home. Clydie Braun states she will do this for about a wk or 2 wk & will call our office back if she needs to. Pt's dgtr voiced understanding & hung up

## 2017-05-24 ENCOUNTER — Ambulatory Visit: Payer: Medicare Other | Admitting: Internal Medicine

## 2017-05-24 VITALS — BP 140/78 | HR 64 | Temp 97.0°F | Resp 16 | Ht 63.0 in | Wt 119.8 lb

## 2017-05-24 DIAGNOSIS — E119 Type 2 diabetes mellitus without complications: Secondary | ICD-10-CM

## 2017-05-24 DIAGNOSIS — E559 Vitamin D deficiency, unspecified: Secondary | ICD-10-CM | POA: Diagnosis not present

## 2017-05-24 DIAGNOSIS — Z23 Encounter for immunization: Secondary | ICD-10-CM | POA: Diagnosis not present

## 2017-05-24 DIAGNOSIS — I10 Essential (primary) hypertension: Secondary | ICD-10-CM | POA: Diagnosis not present

## 2017-05-24 DIAGNOSIS — E782 Mixed hyperlipidemia: Secondary | ICD-10-CM | POA: Diagnosis not present

## 2017-05-24 DIAGNOSIS — Z79899 Other long term (current) drug therapy: Secondary | ICD-10-CM | POA: Diagnosis not present

## 2017-05-24 DIAGNOSIS — I700 Atherosclerosis of aorta: Secondary | ICD-10-CM

## 2017-05-24 NOTE — Patient Instructions (Signed)

## 2017-05-24 NOTE — Progress Notes (Signed)
This very nice 76 y.o. WWF presents for 3 month follow up with Hypertension, Hyperlipidemia, T2_NIDDM and Vitamin D Deficiency.  Patient has had issue with hhort term memory recall and is accompanied by her daughter today.      Patient is treated for HTN (1994) & BP has been controlled at home. Today's BP is at goal - 140/78. Patient has had no complaints of any cardiac type chest pain, palpitations, dyspnea / orthopnea / PND, dizziness, claudication, or dependent edema.     Hyperlipidemia is controlled with diet & meds. Patient denies myalgias or other med SE's. Last Lipids were  Lab Results  Component Value Date   CHOL 209 (H) 05/24/2017   HDL 77 05/24/2017   LDLCALC 109 (H) 11/02/2016   TRIG 111 05/24/2017   CHOLHDL 2.7 05/24/2017      Also, the patient has history of T2_NIDDM (2004) and has had no symptoms of reactive hypoglycemia, diabetic polys, paresthesias or visual blurring.  Last A1c was not at goal: Lab Results  Component Value Date   HGBA1C 5.8 (H) 05/24/2017      Further, the patient also has history of Vitamin D Deficiency ("21" / 2016) and supplements vitamin D without any suspected side-effects. Last vitamin D was at goal: Lab Results  Component Value Date   VD25OH 64 05/24/2017   Current Outpatient Medications on File Prior to Visit  Medication Sig  . ALPRAZolam (XANAX) 0.5 MG tablet Take 1/2 to 1 tablet 3 times a day only if needed for anxiety.  Marland Kitchen amLODipine (NORVASC) 10 MG tablet TAKE 1 TABLET(10 MG) BY MOUTH DAILY  . bisoprolol (ZEBETA) 5 MG tablet TAKE 1 TABLET BY MOUTH DAILY  . Blood Glucose Monitoring Suppl (ONE TOUCH ULTRA SYSTEM KIT) W/DEVICE KIT 1 kit by Does not apply route once. Check glucose 1 time daily-DX-E11.2  . glucose blood (ONE TOUCH ULTRA TEST) test strip Check glucose 1 time daily-DX-E11.2  . Lancets (ONETOUCH ULTRASOFT) lancets Check glucose 1 time daily-DX-E11.2  . metFORMIN (GLUCOPHAGE-XR) 500 MG 24 hr tablet TAKE 1 TABLET BY MOUTH DAILY  OR AS DIRECTED FOR DIABETES  . quinapril (ACCUPRIL) 40 MG tablet TAKE 1 TABLET(40 MG) BY MOUTH DAILY  . TRAVATAN Z 0.004 % SOLN ophthalmic solution Place 1 drop into both eyes at bedtime.    No current facility-administered medications on file prior to visit.    Allergies  Allergen Reactions  . Buspirone   . Clonidine Derivatives   . Cymbalta [Duloxetine Hcl]   . Elavil [Amitriptyline]   . Hytrin [Terazosin]   . Nizofenone   . Paxil [Paroxetine Hcl]   . Prozac [Fluoxetine Hcl]   . Zoloft [Sertraline Hcl]    PMHx:   Past Medical History:  Diagnosis Date  . Depression   . GERD (gastroesophageal reflux disease)   . Hyperlipidemia   . Hypertension   . Type II or unspecified type diabetes mellitus without mention of complication, not stated as uncontrolled   . Vitamin D deficiency    Immunization History  Administered Date(s) Administered  . Influenza Split 05/01/2013  . Influenza, High Dose Seasonal PF 03/31/2014, 04/22/2015, 05/08/2016, 05/24/2017  . Pneumococcal Conjugate-13 07/03/2014  . Pneumococcal Polysaccharide-23 08/05/1998, 05/24/2017  . Td 06/17/2012   Past Surgical History:  Procedure Laterality Date  . CHOLECYSTECTOMY    . TOTAL ABDOMINAL HYSTERECTOMY W/ BILATERAL SALPINGOOPHORECTOMY  1990   FHx:    Reviewed / unchanged  SHx:    Reviewed / unchanged  Systems Review:  Constitutional: Denies fever, chills, wt changes, headaches, insomnia, fatigue, night sweats, change in appetite. Eyes: Denies redness, blurred vision, diplopia, discharge, itchy, watery eyes.  ENT: Denies discharge, congestion, post nasal drip, epistaxis, sore throat, earache, hearing loss, dental pain, tinnitus, vertigo, sinus pain, snoring.  CV: Denies chest pain, palpitations, irregular heartbeat, syncope, dyspnea, diaphoresis, orthopnea, PND, claudication or edema. Respiratory: denies cough, dyspnea, DOE, pleurisy, hoarseness, laryngitis, wheezing.  Gastrointestinal: Denies dysphagia,  odynophagia, heartburn, reflux, water brash, abdominal pain or cramps, nausea, vomiting, bloating, diarrhea, constipation, hematemesis, melena, hematochezia  or hemorrhoids. Genitourinary: Denies dysuria, frequency, urgency, nocturia, hesitancy, discharge, hematuria or flank pain. Musculoskeletal: Denies arthralgias, myalgias, stiffness, jt. swelling, pain, limping or strain/sprain.  Skin: Denies pruritus, rash, hives, warts, acne, eczema or change in skin lesion(s). Neuro: No weakness, tremor, incoordination, spasms, paresthesia or pain. Psychiatric: Denies confusion, memory loss or sensory loss. Endo: Denies change in weight, skin or hair change.  Heme/Lymph: No excessive bleeding, bruising or enlarged lymph nodes.  Physical Exam  BP 140/78   Pulse 64   Temp (!) 97 F (36.1 C)   Resp 16   Ht 5' 3" (1.6 m)   Wt 119 lb 12.8 oz (54.3 kg)   BMI 21.22 kg/m   Appears well nourished, well groomed  and in no distress.  Eyes: PERRLA, EOMs, conjunctiva no swelling or erythema. Sinuses: No frontal/maxillary tenderness ENT/Mouth: EAC's clear, TM's nl w/o erythema, bulging. Nares clear w/o erythema, swelling, exudates. Oropharynx clear without erythema or exudates. Oral hygiene is good. Tongue normal, non obstructing. Hearing intact.  Neck: Supple. Thyroid nl. Car 2+/2+ without bruits, nodes or JVD. Chest: Respirations nl with BS clear & equal w/o rales, rhonchi, wheezing or stridor.  Cor: Heart sounds normal w/ regular rate and rhythm without sig. murmurs, gallops, clicks or rubs. Peripheral pulses normal and equal  without edema.  Abdomen: Soft & bowel sounds normal. Non-tender w/o guarding, rebound, hernias, masses or organomegaly.  Lymphatics: Unremarkable.  Musculoskeletal: Full ROM all peripheral extremities, joint stability, 5/5 strength and normal gait.  Skin: Warm, dry without exposed rashes, lesions or ecchymosis apparent.  Neuro: Cranial nerves intact, reflexes equal bilaterally.  Sensory-motor testing grossly intact. Tendon reflexes grossly intact.  Pysch: Alert & oriented x 3.  Insight and judgement nl & appropriate. No ideations.  Assessment and Plan:  1. Essential hypertension  - Continue medication, monitor blood pressure at home.  - Continue DASH diet. Reminder to go to the ER if any CP,  SOB, nausea, dizziness, severe HA, changes vision/speech.  - CBC with Differential/Platelet - BASIC METABOLIC PANEL WITH GFR - Magnesium - TSH  2. Hyperlipidemia, mixed  - Continue diet/meds, exercise,& lifestyle modifications.  - Continue monitor periodic cholesterol/liver & renal functions   - Hepatic function panel - Lipid panel - TSH  3. T2_NIDDM  - Continue diet, exercise, lifestyle modifications.  - Monitor appropriate labs.  - Hemoglobin A1c - Insulin, random  4. Vitamin D deficiency  - Continue supplementation  - VITAMIN D 25 Hydroxy  5. Atherosclerosis of aorta (HCC)   6. Medication management  - CBC with Differential/Platelet - BASIC METABOLIC PANEL WITH GFR - Hepatic function panel - Magnesium - Lipid panel - TSH - Hemoglobin A1c - Insulin, random - VITAMIN D 25 Hydroxy  7. Need for immunization against influenza  - Flu vaccine HIGH DOSE PF  8. Need for prophylactic vaccination against Streptococcus pneumoniae (pneumococcus)  - Pneumococcal polysaccharide vaccine 23-valent greater than or equal to 2yo subcutaneous/IM          Discussed  regular exercise, BP monitoring, weight control to achieve/maintain BMI less than 25 and discussed med and SE's. Recommended labs to assess and monitor clinical status with further disposition pending results of labs. Over 30 minutes of exam, counseling, chart review was performed.  

## 2017-05-25 LAB — CBC WITH DIFFERENTIAL/PLATELET
BASOS ABS: 50 {cells}/uL (ref 0–200)
BASOS PCT: 0.8 %
EOS ABS: 88 {cells}/uL (ref 15–500)
EOS PCT: 1.4 %
HEMATOCRIT: 38 % (ref 35.0–45.0)
HEMOGLOBIN: 12.8 g/dL (ref 11.7–15.5)
LYMPHS ABS: 1657 {cells}/uL (ref 850–3900)
MCH: 30.2 pg (ref 27.0–33.0)
MCHC: 33.7 g/dL (ref 32.0–36.0)
MCV: 89.6 fL (ref 80.0–100.0)
MONOS PCT: 10 %
MPV: 10.4 fL (ref 7.5–12.5)
NEUTROS ABS: 3875 {cells}/uL (ref 1500–7800)
Neutrophils Relative %: 61.5 %
Platelets: 280 10*3/uL (ref 140–400)
RBC: 4.24 10*6/uL (ref 3.80–5.10)
RDW: 12.3 % (ref 11.0–15.0)
Total Lymphocyte: 26.3 %
WBC mixed population: 630 cells/uL (ref 200–950)
WBC: 6.3 10*3/uL (ref 3.8–10.8)

## 2017-05-25 LAB — BASIC METABOLIC PANEL WITH GFR
BUN/Creatinine Ratio: 40 (calc) — ABNORMAL HIGH (ref 6–22)
BUN: 29 mg/dL — AB (ref 7–25)
CO2: 28 mmol/L (ref 20–32)
CREATININE: 0.72 mg/dL (ref 0.60–0.93)
Calcium: 9.4 mg/dL (ref 8.6–10.4)
Chloride: 107 mmol/L (ref 98–110)
GFR, EST NON AFRICAN AMERICAN: 82 mL/min/{1.73_m2} (ref >=60)
GFR, Est African American: 95 mL/min/{1.73_m2} (ref >=60)
GLUCOSE: 98 mg/dL (ref 65–99)
Potassium: 4 mmol/L (ref 3.5–5.3)
Sodium: 143 mmol/L (ref 135–146)

## 2017-05-25 LAB — HEMOGLOBIN A1C
HEMOGLOBIN A1C: 5.8 %{Hb} — AB (ref <5.7)
Mean Plasma Glucose: 120 (calc)
eAG (mmol/L): 6.6 (calc)

## 2017-05-25 LAB — LIPID PANEL
CHOL/HDL RATIO: 2.7 (calc) (ref <5.0)
CHOLESTEROL: 209 mg/dL — AB (ref <200)
HDL: 77 mg/dL (ref >50)
LDL CHOLESTEROL (CALC): 110 mg/dL — AB
Non-HDL Cholesterol (Calc): 132 mg/dL (calc) — ABNORMAL HIGH (ref <130)
TRIGLYCERIDES: 111 mg/dL (ref <150)

## 2017-05-25 LAB — HEPATIC FUNCTION PANEL
AG RATIO: 1.4 (calc) (ref 1.0–2.5)
ALBUMIN MSPROF: 4.4 g/dL (ref 3.6–5.1)
ALKALINE PHOSPHATASE (APISO): 81 U/L (ref 33–130)
ALT: 16 U/L (ref 6–29)
AST: 19 U/L (ref 10–35)
BILIRUBIN TOTAL: 0.6 mg/dL (ref 0.2–1.2)
Bilirubin, Direct: 0.1 mg/dL (ref 0.0–0.2)
GLOBULIN: 3.1 g/dL (ref 1.9–3.7)
Indirect Bilirubin: 0.5 mg/dL (calc) (ref 0.2–1.2)
TOTAL PROTEIN: 7.5 g/dL (ref 6.1–8.1)

## 2017-05-25 LAB — INSULIN, RANDOM: Insulin: 4.3 u[IU]/mL (ref 2.0–19.6)

## 2017-05-25 LAB — VITAMIN D 25 HYDROXY (VIT D DEFICIENCY, FRACTURES): Vit D, 25-Hydroxy: 64 ng/mL (ref 30–100)

## 2017-05-25 LAB — MAGNESIUM: Magnesium: 2.1 mg/dL (ref 1.5–2.5)

## 2017-05-25 LAB — TSH: TSH: 0.88 m[IU]/L (ref 0.40–4.50)

## 2017-05-26 ENCOUNTER — Encounter: Payer: Self-pay | Admitting: Internal Medicine

## 2017-05-28 NOTE — Progress Notes (Signed)
Assessment and Plan:  Wyoma was seen today for multiple falls.  Diagnoses and all orders for this visit:  Multiple falls -     Urinalysis w microscopic + reflex cultur -     CBC with Differential/Platelet -     BASIC METABOLIC PANEL WITH GFR -     Ambulatory referral to Neurology -     Reached out to Dr. Percival Spanish about possible follow up Holter monitor  Memory loss, short term -     Gradual decline noted over past 2 years with acute decline over past 4 months. Difficulty focusing/following directions, poor word recall/short term memory, some concerning elements to neuro exam -     Ambulatory referral to Neurology  Further disposition pending results of labs. Discussed med's effects and SE's.   Over 30 minutes of exam, counseling, chart review, and critical decision making was performed.   Future Appointments  Date Time Provider [place]  07/31/2017  9:45 AM Gean Birchwood, DPM [facility]  09/11/2017 10:00 AM Vicie Mutters, PA-C [facility] None    ------------------------------------------------------------------------------------------------------------------   HPI BP 134/60   Pulse (!) 58   Temp (!) 97.5 F (36.4 C)   Ht _0  (1.6 m)   Wt 117 lb 3.2 oz (53.2 kg)   SpO2 98%   BMI 20.76 kg/m   76 y.o.female presents accompanied by daughter for two episodes of falling at home over the weekend 3 hours apart. She reports she was washing dishes, became dizzy - she cannot recollect further details of the "fall," though she doesn't believe there was LOC. She denies weakness, CP, palpitations, vision changes at the time. Her fall was observed by several family members observed her "crumple to the floor." They did not believe she hit her head or was otherwise injured. Her daughter reports she did note some "tremors and shaking" of her extremities for a few seconds immediately after. A similar episode of dizziness/fall occurred 3 hours later at which point 911 was  called; EKG strip from that time reivewed - no significant changes when compared to EKG from 02/15/2017. Blood sugar check at the time was normal, she reportedly could not recall current President but recalled within a few minutes. She refused to be taken to the ED. No notable confusion or focal weakness at the time.   She was evaluated on 02/15/2017 for a single episode of questionable syncope (fell /syncope, does not recall falling, woke up on the floor, no witnesses. No loss of bladder control, no pain or confusion afterwards), was referred to cardiology but as she had not had further episodes was thought to be low yield for further workup at the time. MRI was also ordered at that time which showed:  MRI 02/27/2017:  Moderate atrophy with progression since 2015. Moderate chronic microvascular ischemic change in the white matter and pons. No acute Abnormality.  The patient endorses some difficulties with short term memory and recalling names over the last several years. The daughter shares when the patient is out of the room that the family has noted a gradual decline in short term memory over the past 2 years, with an acute decline over the last 4 months- she previously enjoyed calling up family members and talking for 45 min daily, but lately will hang up after 5 min. She appears to struggle with focus and somewhat with following instructions on exam today.   Past Medical History:  Diagnosis Date  . Depression   . GERD (gastroesophageal reflux disease)   .  Hyperlipidemia   . Hypertension   . Type II or unspecified type diabetes mellitus without mention of complication, not stated as uncontrolled   . Vitamin D deficiency      Allergies  Allergen Reactions  . Buspirone   . Clonidine Derivatives   . Cymbalta [Duloxetine Hcl]   . Elavil [Amitriptyline]   . Hytrin [Terazosin]   . Nizofenone   . Paxil [Paroxetine Hcl]   . Prozac [Fluoxetine Hcl]   . Zoloft [Sertraline Hcl]     Current  Outpatient Medications on File Prior to Visit  Medication Sig  . ALPRAZolam (XANAX) 0.5 MG tablet Take 1/2 to 1 tablet 3 times a day only if needed for anxiety.  Marland Kitchen amLODipine (NORVASC) 10 MG tablet TAKE 1 TABLET(10 MG) BY MOUTH DAILY  . bisoprolol (ZEBETA) 5 MG tablet TAKE 1 TABLET BY MOUTH DAILY  . Blood Glucose Monitoring Suppl (ONE TOUCH ULTRA SYSTEM KIT) W/DEVICE KIT 1 kit by Does not apply route once. Check glucose 1 time daily-DX-E11.2  . glucose blood (ONE TOUCH ULTRA TEST) test strip Check glucose 1 time daily-DX-E11.2  . Lancets (ONETOUCH ULTRASOFT) lancets Check glucose 1 time daily-DX-E11.2  . metFORMIN (GLUCOPHAGE-XR) 500 MG 24 hr tablet TAKE 1 TABLET BY MOUTH DAILY OR AS DIRECTED FOR DIABETES  . quinapril (ACCUPRIL) 40 MG tablet TAKE 1 TABLET(40 MG) BY MOUTH DAILY  . TRAVATAN Z 0.004 % SOLN ophthalmic solution Place 1 drop into both eyes at bedtime.    No current facility-administered medications on file prior to visit.     ROS: Review of Systems  Constitutional: Negative for chills, diaphoresis, fever, malaise/fatigue and weight loss.  HENT: Negative for hearing loss and tinnitus.   Eyes: Negative for blurred vision and double vision.  Respiratory: Negative for cough, shortness of breath and wheezing.   Cardiovascular: Negative for chest pain, palpitations, orthopnea, claudication and leg swelling.  Gastrointestinal: Negative for abdominal pain, blood in stool, constipation, diarrhea, heartburn, melena, nausea and vomiting.  Genitourinary: Negative.   Musculoskeletal: Positive for falls. Negative for joint pain and myalgias.  Skin: Negative for rash.  Neurological: Positive for dizziness (Prior to falls). Negative for tingling, tremors, sensory change, speech change, focal weakness, weakness and headaches.  Endo/Heme/Allergies: Negative for polydipsia.  Psychiatric/Behavioral: Negative.  Negative for substance abuse. The patient is not nervous/anxious.   All other systems  reviewed and are negative.    Physical Exam:  BP 134/60   Pulse (!) 58   Temp (!) 97.5 F (36.4 C)   Ht _0  (1.6 m)   Wt 117 lb 3.2 oz (53.2 kg)   SpO2 98%   BMI 20.76 kg/m   General Appearance: Well nourished, in no apparent distress. Eyes: PERRLA, Struggles to perform EOMs with frequent horizontal nystagmus, exaggerated blinking-, conjunctiva with no swelling or erythema Sinuses: No Frontal/maxillary tenderness ENT/Mouth: Ext aud canals clear, TMs without erythema, bulging. No erythema, swelling, or exudate on post pharynx.  Tonsils not swollen or erythematous. Hearing normal.  Neck: Supple, thyroid normal.  Respiratory: Respiratory effort normal, BS equal bilaterally without rales, rhonchi, wheezing or stridor.  Cardio: RRR with no MRGs. 1+ symmetrical peripheral pulses without edema.  Abdomen: Soft, + BS.  Non tender, no guarding, rebound, hernias, masses. Lymphatics: Non tender without lymphadenopathy.  Musculoskeletal: Full ROM, she is quite weak throughout though symmetrical, normal slow gait.   Skin: Warm, dry without rashes, lesions, ecchymosis.  Neuro: Struggles with EOM with frequent nystagmus, exaggerated blinking - able to complete after 2 attempts.  Other cranial nerves appear intact without asymmetry. Normal muscle tone, . Sensation intact throughout. Neg romberg, unable to complete tandem walking without support. Completes heel to shin bilaterally, struggles to complete alternating rapid hand movements despite repeated demonstration. Recalls 1/3 on word recall. Tongue fasciculations noted, no other tremor observed.  Psych: Awake and oriented X 3, normal affect, Insight and Judgment appropriate.     Izora Ribas, NP 11:13 AM [facility]

## 2017-05-29 ENCOUNTER — Encounter: Payer: Self-pay | Admitting: Adult Health

## 2017-05-29 ENCOUNTER — Ambulatory Visit: Payer: Medicare Other | Admitting: Adult Health

## 2017-05-29 VITALS — BP 134/60 | HR 58 | Temp 97.5°F | Ht 63.0 in | Wt 117.2 lb

## 2017-05-29 DIAGNOSIS — R296 Repeated falls: Secondary | ICD-10-CM | POA: Diagnosis not present

## 2017-05-29 DIAGNOSIS — R413 Other amnesia: Secondary | ICD-10-CM | POA: Diagnosis not present

## 2017-05-29 NOTE — Patient Instructions (Signed)
I will reach out to Dr. Caprice RedHockrein about doing a 48- hour holter monitor. We may send you to neurology for evaluation depending on lab results today.   If this happens again, please call 911 and go to the ER for imaging and evaluation.    It is important to avoid accidents which may result in broken bones.  Here are a few ideas on how to make your home safer so you will be less likely to trip or fall.  1. Use nonskid mats or non slip strips in your shower or tub, on your bathroom floor and around sinks.  If you know that you have spilled water, wipe it up! 2. In the bathroom, it is important to have properly installed grab bars on the walls or on the edge of the tub.  Towel racks are NOT strong enough for you to hold onto or to pull on for support. 3. Stairs and hallways should have enough light.  Add lamps or night lights if you need ore light. 4. It is good to have handrails on both sides of the stairs if possible.  Always fix broken handrails right away. 5. It is important to see the edges of steps.  Paint the edges of outdoor steps white so you can see them better.  Put colored tape on the edge of inside steps. 6. Throw-rugs are dangerous because they can slide.  Removing the rugs is the best idea, but if they must stay, add adhesive carpet tape to prevent slipping. 7. Do not keep things on stairs or in the halls.  Remove small furniture that blocks the halls as it may cause you to trip.  Keep telephone and electrical cords out of the way where you walk. 8. Always were sturdy, rubber-soled shoes for good support.  Never wear just socks, especially on the stairs.  Socks may cause you to slip or fall.  Do not wear full-length housecoats as you can easily trip on the bottom.  9. Place the things you use the most on the shelves that are the easiest to reach.  If you use a stepstool, make sure it is in good condition.  If you feel unsteady, DO NOT climb, ask for help. 10. If a health professional  advises you to use a cane or walker, do not be ashamed.  These items can keep you from falling and breaking your bones.

## 2017-05-30 LAB — URINALYSIS W MICROSCOPIC + REFLEX CULTURE
BACTERIA UA: NONE SEEN /HPF
BILIRUBIN URINE: NEGATIVE
GLUCOSE, UA: NEGATIVE
HGB URINE DIPSTICK: NEGATIVE
Hyaline Cast: NONE SEEN /LPF
KETONES UR: NEGATIVE
LEUKOCYTE ESTERASE: NEGATIVE
NITRITES URINE, INITIAL: NEGATIVE
Protein, ur: NEGATIVE
SPECIFIC GRAVITY, URINE: 1.011 (ref 1.001–1.03)
SQUAMOUS EPITHELIAL / LPF: NONE SEEN /HPF (ref <=5)
WBC UA: NONE SEEN /HPF (ref 0–5)
pH: 6 (ref 5.0–8.0)

## 2017-05-30 LAB — CBC WITH DIFFERENTIAL/PLATELET
BASOS PCT: 0.7 %
Basophils Absolute: 48 cells/uL (ref 0–200)
EOS ABS: 129 {cells}/uL (ref 15–500)
Eosinophils Relative: 1.9 %
HCT: 37 % (ref 35.0–45.0)
HEMOGLOBIN: 12.5 g/dL (ref 11.7–15.5)
Lymphs Abs: 1863 cells/uL (ref 850–3900)
MCH: 30.3 pg (ref 27.0–33.0)
MCHC: 33.8 g/dL (ref 32.0–36.0)
MCV: 89.8 fL (ref 80.0–100.0)
MONOS PCT: 10.1 %
MPV: 10.4 fL (ref 7.5–12.5)
NEUTROS ABS: 4073 {cells}/uL (ref 1500–7800)
Neutrophils Relative %: 59.9 %
Platelets: 291 10*3/uL (ref 140–400)
RBC: 4.12 10*6/uL (ref 3.80–5.10)
RDW: 12 % (ref 11.0–15.0)
Total Lymphocyte: 27.4 %
WBC mixed population: 687 cells/uL (ref 200–950)
WBC: 6.8 10*3/uL (ref 3.8–10.8)

## 2017-05-30 LAB — BASIC METABOLIC PANEL WITH GFR
BUN: 17 mg/dL (ref 7–25)
CO2: 28 mmol/L (ref 20–32)
CREATININE: 0.71 mg/dL (ref 0.60–0.93)
Calcium: 9.3 mg/dL (ref 8.6–10.4)
Chloride: 105 mmol/L (ref 98–110)
GFR, Est African American: 97 mL/min/{1.73_m2} (ref >=60)
GFR, Est Non African American: 83 mL/min/{1.73_m2} (ref >=60)
GLUCOSE: 89 mg/dL (ref 65–99)
Potassium: 4.2 mmol/L (ref 3.5–5.3)
Sodium: 140 mmol/L (ref 135–146)

## 2017-05-30 LAB — NO CULTURE INDICATED

## 2017-06-01 ENCOUNTER — Telehealth: Payer: Self-pay | Admitting: *Deleted

## 2017-06-01 DIAGNOSIS — R42 Dizziness and giddiness: Secondary | ICD-10-CM

## 2017-06-01 NOTE — Telephone Encounter (Signed)
48 hour monitor order and send to scheduler to schedule

## 2017-06-01 NOTE — Telephone Encounter (Signed)
-----   Message from Rollene RotundaJames Hochrein, MD sent at 06/01/2017  3:02 PM EST ----- Regarding: FW: Repeated falls Schedule a 48 hour Holter please for dizziness.  Thanks.  ----- Message ----- From: Judd Gaudierorbett, Ashley, NP Sent: 05/29/2017   1:09 PM To: Rollene RotundaJames Hochrein, MD Subject: Repeated falls                                 Good afternoon Dr. Antoine PocheHochrein,  We referred Ms. Melissa Davis to you back in August of this year for a single episode of dizziness with a fall; you saw her and did not feel further workup was justified at the time due to lack of repeated episodes. We saw her today at our clinic for 2 more episodes of dizziness with falls this past weekend. Cannot confirm LOC, patient denies sense of CP/palpitations - my impression was likely more neuro so I have proceeded with referral, but the patient's daughter wanted me to reach out to you and ask about potentially proceeding with a 48-hour holter which was discussed previously.   Thank you for your input, and hope you have a lovely day.  Sincerely, Judd GaudierAshley Corbett, NP-C

## 2017-06-04 ENCOUNTER — Telehealth: Payer: Self-pay | Admitting: Cardiology

## 2017-06-04 NOTE — Telephone Encounter (Signed)
Closed Encounter  °

## 2017-06-09 ENCOUNTER — Other Ambulatory Visit: Payer: Self-pay | Admitting: Internal Medicine

## 2017-06-12 ENCOUNTER — Ambulatory Visit (INDEPENDENT_AMBULATORY_CARE_PROVIDER_SITE_OTHER): Payer: Medicare Other

## 2017-06-12 DIAGNOSIS — R42 Dizziness and giddiness: Secondary | ICD-10-CM | POA: Diagnosis not present

## 2017-06-14 ENCOUNTER — Encounter: Payer: Self-pay | Admitting: Internal Medicine

## 2017-06-25 ENCOUNTER — Telehealth: Payer: Self-pay | Admitting: *Deleted

## 2017-06-25 NOTE — Telephone Encounter (Signed)
Left message for Melissa HumKaren Coggin (daugher listed at emergency contact and number marked primary) - informed her that our office is closing due to inclement weather.  Provided our number to call back to reschedule her mother's appt.  Since she is a new patient, she can be schedule with the first available, appropriate provider.

## 2017-06-26 ENCOUNTER — Ambulatory Visit: Payer: Medicare Other | Admitting: Neurology

## 2017-06-28 ENCOUNTER — Ambulatory Visit: Payer: Medicare Other | Admitting: Neurology

## 2017-06-28 ENCOUNTER — Encounter: Payer: Self-pay | Admitting: Neurology

## 2017-06-28 ENCOUNTER — Encounter (INDEPENDENT_AMBULATORY_CARE_PROVIDER_SITE_OTHER): Payer: Self-pay

## 2017-06-28 VITALS — BP 142/69 | HR 67 | Ht 63.0 in | Wt 119.8 lb

## 2017-06-28 DIAGNOSIS — IMO0002 Reserved for concepts with insufficient information to code with codable children: Secondary | ICD-10-CM

## 2017-06-28 DIAGNOSIS — G3184 Mild cognitive impairment, so stated: Secondary | ICD-10-CM | POA: Diagnosis not present

## 2017-06-28 DIAGNOSIS — R4020 Unspecified coma: Secondary | ICD-10-CM | POA: Diagnosis not present

## 2017-06-28 MED ORDER — DONEPEZIL HCL 10 MG PO TABS: 10.0000 mg | ORAL_TABLET | Freq: Every day | ORAL | 11 refills | Status: AC

## 2017-06-28 MED ORDER — MEMANTINE HCL 10 MG PO TABS: 10.0000 mg | ORAL_TABLET | Freq: Two times a day (BID) | ORAL | 11 refills | Status: AC

## 2017-06-28 NOTE — Progress Notes (Signed)
PATIENT: Melissa Davis DOB: 1941/06/23  Chief Complaint  Patient presents with  . Dementia    MMSE 25/30 - 2 animals.  She is here with her daughter, Melissa Davis.  Reports worening short-term memory, word finding difficulty, hard time following directions, trouble focusing and forgetful of names.    . Fall    She has had three falls.  Once in August where she experienced a loss of consciousness and woke up later in kitchen floor.  Says she had no warning signs prior to this fall.  The second fall was witnessed by her son-in-law and granddaughter.  They witnessed her fall to floor and have some mild body shakes for a few seconds.  She was able to communicate to her family during this event.  She fell one other time later that evening but no one was with her.  Marland Kitchen PCP    Melissa Pinto, MD     HISTORICAL  Melissa Davis 76 year old female, accompanied by her daughter Melissa Davis, seen in refer by her primary care physician Dr. Unk Davis for evaluation of dementia, initial evaluation was on June 29, 2017.  I reviewed and summarized the referring note, she had a history of hypertension, hyperlipidemia, was retired as a Development worker, community entry job at age 20, she lived alone since 1995 after her husband passed away,  Her father suffered dementia at age 62, sister at age 33 has no memory loss,  She was noted to have gradual onset memory loss around 2013, tends to repeat herself, get doctor's appointment, gradually getting worse," she is homebound", with her new onset, slow worsening memory loss, she becomes less interactive with her family members,  In August 2018, she has to be waking up suddenly from her overnight sleep, drive her family to emergency room, was found passing out on the floor couple hours later, since then, she has quit driving, also noted worsening memory loss, tends to pace up and down at night tends to be irritated, needs to be reminded about taking a bath,  there was  no significan gait abnormality at baselinet She was moved to her daughter's house since October 2018, doing well, taking Xanax 0.5 mg every which has helped her sleep,  I personally reviewed MRI of the brain August 2018, moderate atrophy, with progression since 2015, chronic small vessel disease,  48 hours Holter monitor was normal in Jun 12 2017.  Lab A1C 5.8, normal Vit D, CBC, BMP,  Chol 209, LDL 110.   REVIEW OF SYSTEMS: Full 14 system review of systems performed and notable only for memory loss, confusion, dizziness, passing out, tremor, snoring, anxiety, not enough sleep, disinterested in activities, runny nose, feeling hot, increased thirst, urination problem, chest pain  ALLERGIES: Allergies  Allergen Reactions  . Buspirone   . Clonidine Derivatives   . Cymbalta [Duloxetine Hcl]   . Elavil [Amitriptyline]   . Hytrin [Terazosin]   . Nizofenone   . Paxil [Paroxetine Hcl]   . Prozac [Fluoxetine Hcl]   . Zoloft [Sertraline Hcl]     HOME MEDICATIONS: Current Outpatient Medications  Medication Sig Dispense Refill  . ALPRAZolam (XANAX) 0.5 MG tablet TAKE ONE-HALF TO 1 TABLET BY MOUTH THREE TIMES DAILY ONLY AS NEEDED FOR ANXIETY 90 tablet 0  . amLODipine (NORVASC) 10 MG tablet TAKE 1 TABLET(10 MG) BY MOUTH DAILY 90 tablet 1  . bisoprolol (ZEBETA) 5 MG tablet TAKE 1 TABLET BY MOUTH DAILY 90 tablet 0  . Blood Glucose Monitoring  Suppl (ONE TOUCH ULTRA SYSTEM KIT) W/DEVICE KIT 1 kit by Does not apply route once. Check glucose 1 time daily-DX-E11.2 1 each 0  . glucose blood (ONE TOUCH ULTRA TEST) test strip Check glucose 1 time daily-DX-E11.2 100 each 3  . Lancets (ONETOUCH ULTRASOFT) lancets Check glucose 1 time daily-DX-E11.2 100 each PRN  . metFORMIN (GLUCOPHAGE-XR) 500 MG 24 hr tablet TAKE 1 TABLET BY MOUTH DAILY OR AS DIRECTED FOR DIABETES 90 tablet 1  . quinapril (ACCUPRIL) 40 MG tablet TAKE 1 TABLET(40 MG) BY MOUTH DAILY 90 tablet 1  . TRAVATAN Z 0.004 % SOLN ophthalmic  solution Place 1 drop into both eyes at bedtime.      No current facility-administered medications for this visit.     PAST MEDICAL HISTORY: Past Medical History:  Diagnosis Date  . Depression   . Falls   . GERD (gastroesophageal reflux disease)   . Hyperlipidemia   . Hypertension   . Memory loss   . Type II or unspecified type diabetes mellitus without mention of complication, not stated as uncontrolled   . Vitamin D deficiency     PAST SURGICAL HISTORY: Past Surgical History:  Procedure Laterality Date  . CHOLECYSTECTOMY    . TOTAL ABDOMINAL HYSTERECTOMY W/ BILATERAL SALPINGOOPHORECTOMY  1990    FAMILY HISTORY: Family History  Problem Relation Age of Onset  . Hypertension Mother   . Heart attack Mother   . Ulcers Mother   . CVA Father   . Alzheimer's disease Father   . Hypertension Father     SOCIAL HISTORY:  Social History   Socioeconomic History  . Marital status: Widowed    Spouse name: Not on file  . Number of children: 2  . Years of education: 12+ (some business college)  . Highest education level: Not on file  Social Needs  . Financial resource strain: Not on file  . Food insecurity - worry: Not on file  . Food insecurity - inability: Not on file  . Transportation needs - medical: Not on file  . Transportation needs - non-medical: Not on file  Occupational History  . Occupation: Retired  Tobacco Use  . Smoking status: Never Smoker  . Smokeless tobacco: Never Used  Substance and Sexual Activity  . Alcohol use: No  . Drug use: No  . Sexual activity: Not on file  Other Topics Concern  . Not on file  Social History Narrative   Lives with her daughter and son-in-law.   Right-handed.   1 cup caffeine per day.     PHYSICAL EXAM   Vitals:   06/28/17 1402  BP: (!) 142/69  Pulse: 67  Weight: 119 lb 12 oz (54.3 kg)  Height: _0  (1.6 m)    Not recorded      Body mass index is 21.21 kg/m.  PHYSICAL EXAMNIATION:  Gen: NAD,  conversant, well nourised, obese, well groomed                     Cardiovascular: Regular rate rhythm, no peripheral edema, warm, nontender. Eyes: Conjunctivae clear without exudates or hemorrhage Neck: Supple, no carotid bruits. Pulmonary: Clear to auscultation bilaterally   NEUROLOGICAL EXAM: MMSE - Mini Mental State Exam 06/28/2017  Orientation to time 4  Orientation to Place 5  Registration 3  Attention/ Calculation 3  Recall 2  Language- name 2 objects 2  Language- repeat 1  Language- follow 3 step command 3  Language- read & follow direction 1  Write  a sentence 1  Copy design 0  Total score 25  animal naming 2.   CRANIAL NERVES: CN II: Visual fields are full to confrontation. Fundoscopic exam is normal with sharp discs and no vascular changes. Pupils are round equal and briskly reactive to light. CN III, IV, VI: extraocular movement are normal. No ptosis. CN V: Facial sensation is intact to pinprick in all 3 divisions bilaterally. Corneal responses are intact.  CN VII: Face is symmetric with normal eye closure and smile. CN VIII: Hearing is normal to rubbing fingers CN IX, X: Palate elevates symmetrically. Phonation is normal. CN XI: Head turning and shoulder shrug are intact CN XII: Tongue is midline with normal movements and no atrophy.  MOTOR: There is no pronator drift of out-stretched arms. Muscle bulk and tone are normal. Muscle strength is normal.  REFLEXES: Reflexes are 2+ and symmetric at the biceps, triceps, knees, and ankles. Plantar responses are flexor.  SENSORY: Intact to light touch, pinprick, positional sensation and vibratory sensation are intact in fingers and toes.  COORDINATION: Rapid alternating movements and fine finger movements are intact. There is no dysmetria on finger-to-nose and heel-knee-shin.    GAIT/STANCE: Cautious, steady Romberg is absent.   DIAGNOSTIC DATA (LABS, IMAGING, TESTING) - I reviewed patient records, labs, notes,  testing and imaging myself where available.   ASSESSMENT AND PLAN  ROSANN GORUM is a 76 y.o. female   Mild cognitive impairment  Starting Aricept 10 mg daily, Namenda 10 mg twice a day  Laboratory evaluation to rule out treatable etiology  MRI of the brain showed progression of moderate atrophy, family history of dementia, this is most consistent with early Alzheimer's disease,  I emphasize the importance of moderate exercise.   Marcial Pacas, M.D. Ph.D.  [facility] [address], [place], [place] [address] Ph: [phone] Fax: [phone]  CC: Melissa Pinto, MD

## 2017-06-29 LAB — FOLATE: FOLATE: 10.7 ng/mL (ref >3.0)

## 2017-06-29 LAB — VITAMIN B12: VITAMIN B 12: 375 pg/mL (ref 232–1245)

## 2017-07-05 ENCOUNTER — Other Ambulatory Visit: Payer: Self-pay | Admitting: Internal Medicine

## 2017-07-06 ENCOUNTER — Other Ambulatory Visit: Payer: Self-pay | Admitting: Internal Medicine

## 2017-07-18 ENCOUNTER — Emergency Department (HOSPITAL_COMMUNITY)
Admission: EM | Admit: 2017-07-18 | Discharge: 2017-07-18 | Disposition: A | Discharge status: Home / Self Care | Payer: Medicare Other | Source: Home / Self Care | Attending: Emergency Medicine | Admitting: Emergency Medicine

## 2017-07-18 ENCOUNTER — Other Ambulatory Visit: Payer: Self-pay

## 2017-07-18 ENCOUNTER — Emergency Department (HOSPITAL_COMMUNITY): Payer: Medicare Other

## 2017-07-18 DIAGNOSIS — R296 Repeated falls: Secondary | ICD-10-CM | POA: Diagnosis present

## 2017-07-18 DIAGNOSIS — Y998 Other external cause status: Secondary | ICD-10-CM | POA: Insufficient documentation

## 2017-07-18 DIAGNOSIS — Z8249 Family history of ischemic heart disease and other diseases of the circulatory system: Secondary | ICD-10-CM

## 2017-07-18 DIAGNOSIS — Z7984 Long term (current) use of oral hypoglycemic drugs: Secondary | ICD-10-CM | POA: Insufficient documentation

## 2017-07-18 DIAGNOSIS — N179 Acute kidney failure, unspecified: Secondary | ICD-10-CM | POA: Diagnosis present

## 2017-07-18 DIAGNOSIS — F329 Major depressive disorder, single episode, unspecified: Secondary | ICD-10-CM | POA: Diagnosis present

## 2017-07-18 DIAGNOSIS — Z79899 Other long term (current) drug therapy: Secondary | ICD-10-CM | POA: Insufficient documentation

## 2017-07-18 DIAGNOSIS — J9601 Acute respiratory failure with hypoxia: Secondary | ICD-10-CM | POA: Diagnosis not present

## 2017-07-18 DIAGNOSIS — G9341 Metabolic encephalopathy: Secondary | ICD-10-CM | POA: Diagnosis present

## 2017-07-18 DIAGNOSIS — W06XXXA Fall from bed, initial encounter: Secondary | ICD-10-CM

## 2017-07-18 DIAGNOSIS — R55 Syncope and collapse: Secondary | ICD-10-CM | POA: Insufficient documentation

## 2017-07-18 DIAGNOSIS — K219 Gastro-esophageal reflux disease without esophagitis: Secondary | ICD-10-CM | POA: Diagnosis present

## 2017-07-18 DIAGNOSIS — I10 Essential (primary) hypertension: Secondary | ICD-10-CM | POA: Diagnosis present

## 2017-07-18 DIAGNOSIS — Z888 Allergy status to other drugs, medicaments and biological substances status: Secondary | ICD-10-CM

## 2017-07-18 DIAGNOSIS — Y9384 Activity, sleeping: Secondary | ICD-10-CM

## 2017-07-18 DIAGNOSIS — F0390 Unspecified dementia without behavioral disturbance: Secondary | ICD-10-CM | POA: Diagnosis present

## 2017-07-18 DIAGNOSIS — Y92013 Bedroom of single-family (private) house as the place of occurrence of the external cause: Secondary | ICD-10-CM | POA: Insufficient documentation

## 2017-07-18 DIAGNOSIS — E119 Type 2 diabetes mellitus without complications: Secondary | ICD-10-CM

## 2017-07-18 DIAGNOSIS — A419 Sepsis, unspecified organism: Principal | ICD-10-CM | POA: Diagnosis present

## 2017-07-18 DIAGNOSIS — E785 Hyperlipidemia, unspecified: Secondary | ICD-10-CM | POA: Diagnosis present

## 2017-07-18 DIAGNOSIS — J189 Pneumonia, unspecified organism: Secondary | ICD-10-CM | POA: Diagnosis present

## 2017-07-18 DIAGNOSIS — E872 Acidosis: Secondary | ICD-10-CM | POA: Diagnosis present

## 2017-07-18 DIAGNOSIS — Z66 Do not resuscitate: Secondary | ICD-10-CM | POA: Diagnosis present

## 2017-07-18 DIAGNOSIS — R6520 Severe sepsis without septic shock: Secondary | ICD-10-CM | POA: Diagnosis present

## 2017-07-18 DIAGNOSIS — D709 Neutropenia, unspecified: Secondary | ICD-10-CM | POA: Diagnosis present

## 2017-07-18 DIAGNOSIS — G40909 Epilepsy, unspecified, not intractable, without status epilepticus: Secondary | ICD-10-CM | POA: Diagnosis present

## 2017-07-18 LAB — COMPREHENSIVE METABOLIC PANEL
ALBUMIN: 3.9 g/dL (ref 3.5–5.0)
ALK PHOS: 86 U/L (ref 38–126)
ALT: 34 U/L (ref 14–54)
ANION GAP: 9 (ref 5–15)
AST: 38 U/L (ref 15–41)
BILIRUBIN TOTAL: 0.7 mg/dL (ref 0.3–1.2)
BUN: 18 mg/dL (ref 6–20)
CALCIUM: 9 mg/dL (ref 8.9–10.3)
CO2: 24 mmol/L (ref 22–32)
CREATININE: 0.76 mg/dL (ref 0.44–1.00)
Chloride: 104 mmol/L (ref 101–111)
GFR calc non Af Amer: >60 mL/min (ref >60)
GLUCOSE: 123 mg/dL — AB (ref 65–99)
Potassium: 3.5 mmol/L (ref 3.5–5.1)
Sodium: 137 mmol/L (ref 135–145)
TOTAL PROTEIN: 7.4 g/dL (ref 6.5–8.1)

## 2017-07-18 LAB — CBC WITH DIFFERENTIAL/PLATELET
Basophils Absolute: 0 10*3/uL (ref 0.0–0.1)
Basophils Relative: 0 %
Eosinophils Absolute: 0 10*3/uL (ref 0.0–0.7)
Eosinophils Relative: 0 %
HEMATOCRIT: 39.3 % (ref 36.0–46.0)
HEMOGLOBIN: 12.9 g/dL (ref 12.0–15.0)
LYMPHS ABS: 0.7 10*3/uL (ref 0.7–4.0)
Lymphocytes Relative: 9 %
MCH: 30 pg (ref 26.0–34.0)
MCHC: 32.8 g/dL (ref 30.0–36.0)
MCV: 91.4 fL (ref 78.0–100.0)
MONOS PCT: 11 %
Monocytes Absolute: 0.8 10*3/uL (ref 0.1–1.0)
NEUTROS ABS: 5.9 10*3/uL (ref 1.7–7.7)
NEUTROS PCT: 80 %
Platelets: 234 10*3/uL (ref 150–400)
RBC: 4.3 MIL/uL (ref 3.87–5.11)
RDW: 13.3 % (ref 11.5–15.5)
WBC: 7.5 10*3/uL (ref 4.0–10.5)

## 2017-07-18 LAB — URINALYSIS, ROUTINE W REFLEX MICROSCOPIC
Bilirubin Urine: NEGATIVE
GLUCOSE, UA: NEGATIVE mg/dL
KETONES UR: 15 mg/dL — AB
LEUKOCYTES UA: NEGATIVE
Nitrite: NEGATIVE
PROTEIN: 30 mg/dL — AB
Specific Gravity, Urine: 1.025 (ref 1.005–1.030)
pH: 5.5 (ref 5.0–8.0)

## 2017-07-18 LAB — URINALYSIS, MICROSCOPIC (REFLEX): WBC, UA: NONE SEEN WBC/hpf (ref 0–5)

## 2017-07-18 NOTE — ED Notes (Signed)
Patient transported to CT 

## 2017-07-18 NOTE — ED Triage Notes (Signed)
Patient fell from bed while sleeping and was discovered by family. Bed is approx 1 1/2 feet tall. Patient has skin tear on left hand. Patient denies any complaints at this time.

## 2017-07-18 NOTE — ED Provider Notes (Signed)
[facility] EMERGENCY DEPARTMENT Provider Note   CSN: [id number] Arrival date & time: 07/18/17  0941     History   Chief Complaint Chief Complaint  Patient presents with  . Fall  . Weakness    HPI Melissa Davis is a 77 y.o. female.   Patient is a 77 year old female with a history of hypertension, hyperlipidemia, GERD, diabetes, mild cognitive impairment presenting today by EMS after a fall out of bed.  Patient states she does not remember prior but thought she was sleeping and then woke up on the floor.  Patient was incontinent but unclear if that was related to the fall or head happened while she was sleeping.  Patient denies headache, nausea, vomiting, chest pain, shortness of breath.  Patient was able to ambulate at home and family said it was at baseline.  Patient denies any dizziness or extremity pain.  Family did note that patient was leaning towards the left which seemed unusual she also felt that she was a little bit shaky.  However she had denies any abdominal pain or urinary symptoms.  Patient denies any recent medication changes.  From falling out of bed onto the carpet she did get a small cut on her left hand.  Daughter arrived shortly after patient's arrival and states she has had several falls over the last few months.    The history is provided by the patient.    Past Medical History:  Diagnosis Date  . Depression   . Falls   . GERD (gastroesophageal reflux disease)   . Hyperlipidemia   . Hypertension   . Memory loss   . Type II or unspecified type diabetes mellitus without mention of complication, not stated as uncontrolled   . Vitamin D deficiency     Patient Active Problem List   Diagnosis Date Noted  . Mild cognitive impairment 06/28/2017  . Passed out [facility]) 06/28/2017  . Syncope 03/16/2017  . Atherosclerosis of aorta ([place]) 02/27/2017  . Body mass index (BMI) of 20.0-20.9 in adult 04/22/2015  . Osteopenia 07/03/2014  . Medication  management 09/26/2013  . Glaucoma 09/26/2013  . Prediabetes   . Hyperlipidemia   . Hypertension   . Vitamin D deficiency   . GERD (gastroesophageal reflux disease)   . Depression, major, in remission [facility])     Past Surgical History:  Procedure Laterality Date  . CHOLECYSTECTOMY    . TOTAL ABDOMINAL HYSTERECTOMY W/ BILATERAL SALPINGOOPHORECTOMY  1990    OB History    No data available       Home Medications    Prior to Admission medications   Medication Sig Start Date End Date Taking? Authorizing Provider  ALPRAZolam (XANAX) 0.5 MG tablet TAKE ONE-HALF TO 1 TABLET BY MOUTH THREE TIMES DAILY ONLY AS NEEDED FOR ANXIETY 06/10/17   Unk Pinto, MD  amLODipine (NORVASC) 10 MG tablet TAKE 1 TABLET(10 MG) BY MOUTH DAILY 01/06/17   Unk Pinto, MD  bisoprolol (ZEBETA) 5 MG tablet TAKE 1 TABLET BY MOUTH DAILY 07/06/17   Unk Pinto, MD  Blood Glucose Monitoring Suppl (ONE TOUCH ULTRA SYSTEM KIT) W/DEVICE KIT 1 kit by Does not apply route once. Check glucose 1 time daily-DX-E11.2 12/30/14   Unk Pinto, MD  donepezil (ARICEPT) 10 MG tablet Take 1 tablet (10 mg total) by mouth at bedtime. 06/28/17   Marcial Pacas, MD  glucose blood (ONE TOUCH ULTRA TEST) test strip Check glucose 1 time daily-DX-E11.2 02/15/16   Unk Pinto, MD  Lancets [facility]  ULTRASOFT) lancets Check glucose 1 time daily-DX-E11.2 12/30/14   Unk Pinto, MD  memantine (NAMENDA) 10 MG tablet Take 1 tablet (10 mg total) by mouth 2 (two) times daily. 06/28/17   Marcial Pacas, MD  metFORMIN (GLUCOPHAGE-XR) 500 MG 24 hr tablet TAKE 1 TABLET BY MOUTH DAILY OR AS DIRECTED FOR DIABETES 02/15/17   Vicie Mutters, PA-C  metFORMIN (GLUCOPHAGE-XR) 500 MG 24 hr tablet TAKE 4 TABLETS BY MOUTH DAILY OR AS DIRECTED FOR DIABETES 07/05/17   Vicie Mutters, PA-C  quinapril (ACCUPRIL) 40 MG tablet TAKE 1 TABLET(40 MG) BY MOUTH DAILY 04/03/17   Unk Pinto, MD  TRAVATAN Z 0.004 % SOLN ophthalmic solution Place 1 drop  into both eyes at bedtime.  05/07/13   [provider]    Family History Family History  Problem Relation Age of Onset  . Hypertension Mother   . Heart attack Mother   . Ulcers Mother   . CVA Father   . Alzheimer's disease Father   . Hypertension Father     Social History Social History   Tobacco Use  . Smoking status: Never Smoker  . Smokeless tobacco: Never Used  Substance Use Topics  . Alcohol use: No  . Drug use: No     Allergies   Buspirone; Clonidine derivatives; Cymbalta [duloxetine hcl]; Elavil [amitriptyline]; Hytrin [terazosin]; Nizofenone; Paxil [paroxetine hcl]; Prozac [fluoxetine hcl]; and Zoloft [sertraline hcl]   Review of Systems Review of Systems  All other systems reviewed and are negative.    Physical Exam Updated Vital Signs BP (!) 149/55 (BP Location: Left Arm)   Pulse 82   Temp 98.4 F (36.9 C) (Oral)   Resp 16   Ht 5' 3"  (1.6 m)   Wt 54.4 kg (120 lb)   SpO2 99%   BMI 21.26 kg/m    Physical Exam  Constitutional: She is oriented to person, place, and time. She appears well-developed and well-nourished. No distress.  HENT:  Head: Normocephalic and atraumatic.  Mouth/Throat: Oropharynx is clear and moist.  Eyes: Conjunctivae and EOM are normal. Pupils are equal, round, and reactive to light.  Neck: Normal range of motion. Neck supple. No spinous process tenderness and no muscular tenderness present. Normal range of motion present.  Cardiovascular: Normal rate, regular rhythm and intact distal pulses.  No murmur heard. Pulmonary/Chest: Effort normal and breath sounds normal. No respiratory distress. She has no wheezes. She has no rales.  Abdominal: Soft. She exhibits no distension. There is no tenderness. There is no rebound and no guarding.  Musculoskeletal: Normal range of motion. She exhibits no edema or tenderness.       Left wrist: Normal.       Hands: Neurological: She is alert and oriented to person, place, and time. No  cranial nerve deficit.  Mild tremor in bilateral hands when held out in front of her.  However no pronator drift.  5 out of 5 strength in upper and lower extremities bilaterally.  No nystagmus.  Skin: Skin is warm and dry. No rash noted. No erythema.  Psychiatric: She has a normal mood and affect. Her behavior is normal.  Nursing note and vitals reviewed.    ED Treatments / Results  Labs (all labs ordered are listed, but only abnormal results are displayed) Labs Reviewed  COMPREHENSIVE METABOLIC PANEL - Abnormal; Notable for the following components:      Result Value   Glucose, Bld 123 (*)    All other components within normal limits  URINALYSIS, ROUTINE W  REFLEX MICROSCOPIC - Abnormal; Notable for the following components:   APPearance CLOUDY (*)    Hgb urine dipstick TRACE (*)    Ketones, ur 15 (*)    Protein, ur 30 (*)    All other components within normal limits  URINALYSIS, MICROSCOPIC (REFLEX) - Abnormal; Notable for the following components:   Bacteria, UA RARE (*)    Squamous Epithelial / LPF 0-5 (*)    All other components within normal limits  CBC WITH DIFFERENTIAL/PLATELET    EKG  EKG Interpretation  Date/Time:  Wednesday July 18 2017 10:14:56 EST Ventricular Rate:  89 PR Interval:    QRS Duration: 106 QT Interval:  342 QTC Calculation: 419 R Axis:   74 Text Interpretation:  Sinus rhythm Anteroseptal infarct, age indeterminate Artifact Confirmed by Blanchie Dessert [phone]) on 07/18/2017 10:24:28 AM       Radiology Ct Head Wo Contrast  Result Date: 07/18/2017 CLINICAL DATA:  Patient fell out of bed and does not remember anything, she was found by her family members. Patient denies "any pain" EXAM: CT HEAD WITHOUT CONTRAST TECHNIQUE: Contiguous axial images were obtained from the base of the skull through the vertex without intravenous contrast. COMPARISON:  07/29/2013 FINDINGS: Brain: Diffuse parenchymal atrophy. Patchy areas of hypoattenuation in deep and  periventricular white matter bilaterally. Negative for acute intracranial hemorrhage, mass lesion, acute infarction, midline shift, or mass-effect. Acute infarct may be inapparent on noncontrast CT. Ventricles and sulci symmetric. Vascular: Atherosclerotic and physiologic intracranial calcifications. Skull: Normal. Negative for fracture or focal lesion. Sinuses/Orbits: Fluid level in the left maxillary sinus. Patchy opacification of ethmoid air cells. Chronic leftward deviation of the nasal septum. Other: None IMPRESSION: 1.  Negative for bleed or other acute intracranial process. 2. Atrophy and nonspecific white matter changes. 3. Left maxillary and ethmoid sinus disease. Electronically Signed   By: Lucrezia Europe M.D.   On: 07/18/2017 11:14    Procedures Procedures (including critical care time)  Medications Ordered in ED Medications - No data to display   Initial Impression / Assessment and Plan / ED Course  I have reviewed the triage vital signs and the nursing notes.  Pertinent labs & imaging results that were available during my care of the patient were reviewed by me and considered in my medical decision making (see chart for details).     Patient presenting today after a fall out of bed.  Of note patient has been seen by her PCP in the last 2 months for multiple falls.  Also a history of possible syncope she is being worked up as an outpatient.  Negative brain MRI in August but no head imaging since the falls a month and a half ago.  Patient is awake alert and neurologically intact.  She did have some incontinence during this episode today but it is unclear whether she was just sleeping and rolled out of bed or had a syncopal event.  Patient denies any chest pain or palpitations.  She is at baseline per family.  She was able to ambulate per the paramedics.  She does not show any signs of stroke today.  However concern for possible head bleed due to recurrent falls in the last few months.  We  will also check urine, blood work and EKG to ensure no acute abnormalities.  Patient has a small skin tear on her left hand that will just need localized wound care.  No other injuries noted.  10:24 AM Since daughter arrived and states that she  doubts her mother rolled out of bed today and is more thinking that she went to the bathroom and when she came back she passed out falling onto the floor based on where she was lying in the floor.  This is happened to the patient in the past within the last few months and has been evaluated by cardiology, neurology and her PCP.  She is worn 2 separate monitors which both have been negative for any type of dysrhythmia.  She is also had other normal cardiac testing.  Because of the syncope is unknown at this time.  EKG today shows a sinus rhythm with significant artifact due to patient having a tremor however daughter states the tremor has been present for several months.  2:01 PM Labs are reassuring and Imaging is neg for acute findings.  Will d/c home.  Final Clinical Impressions(s) / ED Diagnoses   Final diagnoses:  Syncope, unspecified syncope type    ED Discharge Orders    None      Blanchie Dessert, MD 07/18/17 1402

## 2017-07-19 ENCOUNTER — Telehealth: Payer: Self-pay | Admitting: Neurology

## 2017-07-19 DIAGNOSIS — R55 Syncope and collapse: Secondary | ICD-10-CM

## 2017-07-19 MED ORDER — DIVALPROEX SODIUM ER 500 MG PO TB24: 500.0000 mg | ORAL_TABLET | Freq: Every day | ORAL | 5 refills | Status: AC

## 2017-07-19 NOTE — Telephone Encounter (Signed)
Dr. Terrace ArabiaYan has review patient's chart.  Per vo by Dr. Terrace ArabiaYan, patient should have EEG, start Depakote ER 500mg , one tablet at bedtime, and come in for an earlier follow up.  Spoke to Robin (dgt on [employer]) - she is agreeable to the above plan.  Her mother is now living with her and will not be alone.  She will pick up the prescription to start today.  She is aware to expect a call for EEG appt.  EEG order placed, prescription sent to the requested pharmacy and appt rescheduled for a sooner time.

## 2017-07-19 NOTE — Telephone Encounter (Signed)
Pts daughter called to let us know her mother fell yesterday morning, went to the hospital and lost conciseness. Pts daughter is worried and wanted to know if she needed to been seen sooner

## 2017-07-20 ENCOUNTER — Telehealth: Payer: Self-pay | Admitting: *Deleted

## 2017-07-20 NOTE — Telephone Encounter (Signed)
Daughter called and reported the patient has a cough and asked about taking OTC Mucinex.  Per Dr Oneta RackMcKeown, she can take OTC Mucus Relief 1200 mg 2 times a day.  Daughter is aware.

## 2017-07-21 ENCOUNTER — Inpatient Hospital Stay (HOSPITAL_COMMUNITY): Payer: Medicare Other | Admitting: Anesthesiology

## 2017-07-21 ENCOUNTER — Inpatient Hospital Stay (HOSPITAL_COMMUNITY)
Admission: EM | Admit: 2017-07-21 | Discharge: 2017-08-17 | DRG: 871 | Disposition: E | Payer: Medicare Other | Attending: Pulmonary Disease | Admitting: Pulmonary Disease

## 2017-07-21 ENCOUNTER — Encounter (HOSPITAL_COMMUNITY): Payer: Self-pay

## 2017-07-21 ENCOUNTER — Telehealth: Payer: Self-pay | Admitting: Neurology

## 2017-07-21 ENCOUNTER — Inpatient Hospital Stay (HOSPITAL_COMMUNITY): Payer: Medicare Other

## 2017-07-21 ENCOUNTER — Emergency Department (HOSPITAL_COMMUNITY): Payer: Medicare Other

## 2017-07-21 DIAGNOSIS — J9601 Acute respiratory failure with hypoxia: Secondary | ICD-10-CM

## 2017-07-21 DIAGNOSIS — Z4659 Encounter for fitting and adjustment of other gastrointestinal appliance and device: Secondary | ICD-10-CM

## 2017-07-21 DIAGNOSIS — Z66 Do not resuscitate: Secondary | ICD-10-CM | POA: Diagnosis present

## 2017-07-21 DIAGNOSIS — A419 Sepsis, unspecified organism: Secondary | ICD-10-CM | POA: Diagnosis present

## 2017-07-21 DIAGNOSIS — J188 Other pneumonia, unspecified organism: Secondary | ICD-10-CM

## 2017-07-21 DIAGNOSIS — Z79899 Other long term (current) drug therapy: Secondary | ICD-10-CM | POA: Diagnosis not present

## 2017-07-21 DIAGNOSIS — F0390 Unspecified dementia without behavioral disturbance: Secondary | ICD-10-CM | POA: Diagnosis present

## 2017-07-21 DIAGNOSIS — E119 Type 2 diabetes mellitus without complications: Secondary | ICD-10-CM | POA: Diagnosis present

## 2017-07-21 DIAGNOSIS — E785 Hyperlipidemia, unspecified: Secondary | ICD-10-CM | POA: Diagnosis present

## 2017-07-21 DIAGNOSIS — Z7984 Long term (current) use of oral hypoglycemic drugs: Secondary | ICD-10-CM | POA: Diagnosis not present

## 2017-07-21 DIAGNOSIS — F329 Major depressive disorder, single episode, unspecified: Secondary | ICD-10-CM | POA: Diagnosis present

## 2017-07-21 DIAGNOSIS — Z888 Allergy status to other drugs, medicaments and biological substances status: Secondary | ICD-10-CM | POA: Diagnosis not present

## 2017-07-21 DIAGNOSIS — J189 Pneumonia, unspecified organism: Secondary | ICD-10-CM

## 2017-07-21 DIAGNOSIS — Z8249 Family history of ischemic heart disease and other diseases of the circulatory system: Secondary | ICD-10-CM | POA: Diagnosis not present

## 2017-07-21 DIAGNOSIS — D709 Neutropenia, unspecified: Secondary | ICD-10-CM | POA: Diagnosis present

## 2017-07-21 DIAGNOSIS — R509 Fever, unspecified: Secondary | ICD-10-CM

## 2017-07-21 DIAGNOSIS — I10 Essential (primary) hypertension: Secondary | ICD-10-CM | POA: Diagnosis present

## 2017-07-21 DIAGNOSIS — E872 Acidosis: Secondary | ICD-10-CM | POA: Diagnosis present

## 2017-07-21 DIAGNOSIS — N179 Acute kidney failure, unspecified: Secondary | ICD-10-CM | POA: Diagnosis present

## 2017-07-21 DIAGNOSIS — G9341 Metabolic encephalopathy: Secondary | ICD-10-CM | POA: Diagnosis present

## 2017-07-21 DIAGNOSIS — K219 Gastro-esophageal reflux disease without esophagitis: Secondary | ICD-10-CM | POA: Diagnosis present

## 2017-07-21 DIAGNOSIS — R6520 Severe sepsis without septic shock: Secondary | ICD-10-CM | POA: Diagnosis present

## 2017-07-21 DIAGNOSIS — R296 Repeated falls: Secondary | ICD-10-CM | POA: Diagnosis present

## 2017-07-21 DIAGNOSIS — G40909 Epilepsy, unspecified, not intractable, without status epilepticus: Secondary | ICD-10-CM | POA: Diagnosis present

## 2017-07-21 DIAGNOSIS — Z01818 Encounter for other preprocedural examination: Secondary | ICD-10-CM

## 2017-07-21 LAB — COMPREHENSIVE METABOLIC PANEL
ALBUMIN: 2.8 g/dL — AB (ref 3.5–5.0)
ALK PHOS: 47 U/L (ref 38–126)
ALT: 31 U/L (ref 14–54)
ANION GAP: 11 (ref 5–15)
AST: 57 U/L — ABNORMAL HIGH (ref 15–41)
BUN: 31 mg/dL — ABNORMAL HIGH (ref 6–20)
CHLORIDE: 107 mmol/L (ref 101–111)
CO2: 20 mmol/L — AB (ref 22–32)
Calcium: 8.1 mg/dL — ABNORMAL LOW (ref 8.9–10.3)
Creatinine, Ser: 1.27 mg/dL — ABNORMAL HIGH (ref 0.44–1.00)
GFR calc Af Amer: 47 mL/min — ABNORMAL LOW (ref >60)
GFR calc non Af Amer: 40 mL/min — ABNORMAL LOW (ref >60)
GLUCOSE: 166 mg/dL — AB (ref 65–99)
Potassium: 3.2 mmol/L — ABNORMAL LOW (ref 3.5–5.1)
SODIUM: 138 mmol/L (ref 135–145)
Total Bilirubin: 1.2 mg/dL (ref 0.3–1.2)
Total Protein: 6.6 g/dL (ref 6.5–8.1)

## 2017-07-21 LAB — BLOOD GAS, ARTERIAL
ACID-BASE DEFICIT: 4.3 mmol/L — AB (ref 0.0–2.0)
Acid-base deficit: 10.5 mmol/L — ABNORMAL HIGH (ref 0.0–2.0)
Bicarbonate: 20.1 mmol/L (ref 20.0–28.0)
Bicarbonate: 18.1 mmol/L — ABNORMAL LOW (ref 20.0–28.0)
Delivery systems: POSITIVE
Drawn by: 103701
Drawn by: 11249
FIO2: 100
FIO2: 100
LHR: 10 {breaths}/min
LHR: 14 {breaths}/min
O2 SAT: 95 %
O2 SAT: 67.1 %
PCO2 ART: 42.7 mmHg (ref 32.0–48.0)
PCO2 ART: 61.9 mmHg — AB (ref 32.0–48.0)
PEEP/CPAP: 8 cmH2O
PEEP: 10 cmH2O
PH ART: 7.313 — AB (ref 7.350–7.450)
Patient temperature: 104.1
Patient temperature: 39.5
Pressure control: 13 cmH2O
VT: 310 mL
pH, Arterial: 7.111 — CL (ref 7.350–7.450)
pO2, Arterial: 105 mmHg (ref 83.0–108.0)
pO2, Arterial: 56 mmHg — ABNORMAL LOW (ref 83.0–108.0)

## 2017-07-21 LAB — CBC WITH DIFFERENTIAL/PLATELET
BASOS PCT: 0 %
Basophils Absolute: 0 10*3/uL (ref 0.0–0.1)
EOS PCT: 1 %
Eosinophils Absolute: 0 10*3/uL (ref 0.0–0.7)
HEMATOCRIT: 39.9 % (ref 36.0–46.0)
HEMOGLOBIN: 13.5 g/dL (ref 12.0–15.0)
Lymphocytes Relative: 28 %
Lymphs Abs: 0.4 10*3/uL — ABNORMAL LOW (ref 0.7–4.0)
MCH: 30.5 pg (ref 26.0–34.0)
MCHC: 33.8 g/dL (ref 30.0–36.0)
MCV: 90.3 fL (ref 78.0–100.0)
Monocytes Absolute: 0.1 10*3/uL (ref 0.1–1.0)
Monocytes Relative: 7 %
NEUTROS ABS: 0.8 10*3/uL — AB (ref 1.7–7.7)
Neutrophils Relative %: 64 %
Platelets: 174 10*3/uL (ref 150–400)
RBC: 4.42 MIL/uL (ref 3.87–5.11)
RDW: 13.2 % (ref 11.5–15.5)
WBC: 1.3 10*3/uL — CL (ref 4.0–10.5)

## 2017-07-21 LAB — MRSA PCR SCREENING: MRSA BY PCR: NEGATIVE

## 2017-07-21 LAB — BASIC METABOLIC PANEL
ANION GAP: 12 (ref 5–15)
BUN: 32 mg/dL — AB (ref 6–20)
CO2: 15 mmol/L — ABNORMAL LOW (ref 22–32)
Calcium: 7 mg/dL — ABNORMAL LOW (ref 8.9–10.3)
Chloride: 112 mmol/L — ABNORMAL HIGH (ref 101–111)
Creatinine, Ser: 1.31 mg/dL — ABNORMAL HIGH (ref 0.44–1.00)
GFR calc Af Amer: 45 mL/min — ABNORMAL LOW (ref >60)
GFR, EST NON AFRICAN AMERICAN: 39 mL/min — AB (ref >60)
Glucose, Bld: 161 mg/dL — ABNORMAL HIGH (ref 65–99)
Potassium: 4 mmol/L (ref 3.5–5.1)
SODIUM: 139 mmol/L (ref 135–145)

## 2017-07-21 LAB — TROPONIN I: Troponin I: 0.04 ng/mL (ref <0.03)

## 2017-07-21 LAB — URINALYSIS, ROUTINE W REFLEX MICROSCOPIC
Bilirubin Urine: NEGATIVE
Glucose, UA: 50 mg/dL — AB
KETONES UR: 20 mg/dL — AB
Leukocytes, UA: NEGATIVE
Nitrite: NEGATIVE
Protein, ur: 100 mg/dL — AB
SPECIFIC GRAVITY, URINE: 1.023 (ref 1.005–1.030)
SQUAMOUS EPITHELIAL / LPF: NONE SEEN
pH: 5 (ref 5.0–8.0)

## 2017-07-21 LAB — I-STAT CG4 LACTIC ACID, ED: Lactic Acid, Venous: 4.29 mmol/L (ref 0.5–1.9)

## 2017-07-21 LAB — LACTIC ACID, PLASMA: Lactic Acid, Venous: 5.7 mmol/L (ref 0.5–1.9)

## 2017-07-21 LAB — INFLUENZA PANEL BY PCR (TYPE A & B)
INFLAPCR: POSITIVE — AB
INFLBPCR: NEGATIVE

## 2017-07-21 LAB — PROCALCITONIN: Procalcitonin: 54.14 ng/mL

## 2017-07-21 MED ORDER — SODIUM CHLORIDE 0.9 % IV SOLN
2.0000 mg/h | INTRAVENOUS | Status: DC
  Administered 2017-07-21: 2 mg/h via INTRAVENOUS
  Filled 2017-07-21: qty 10

## 2017-07-21 MED ORDER — ACETAMINOPHEN 500 MG PO TABS
1000.0000 mg | ORAL_TABLET | Freq: Once | ORAL | Status: DC
  Filled 2017-07-21: qty 2

## 2017-07-21 MED ORDER — SUCCINYLCHOLINE CHLORIDE 20 MG/ML IJ SOLN
INTRAMUSCULAR | Status: DC | PRN
  Administered 2017-07-21: 100 mg via INTRAVENOUS

## 2017-07-21 MED ORDER — MIDAZOLAM HCL 2 MG/2ML IJ SOLN
2.0000 mg | Freq: Once | INTRAMUSCULAR | Status: AC
  Administered 2017-07-21: 2 mg via INTRAVENOUS

## 2017-07-21 MED ORDER — SODIUM CHLORIDE 0.9 % IV SOLN
100.0000 ug/h | INTRAVENOUS | Status: DC
  Administered 2017-07-21: 300 ug/h via INTRAVENOUS
  Filled 2017-07-21: qty 50

## 2017-07-21 MED ORDER — SODIUM CHLORIDE 0.9 % IV BOLUS (SEPSIS)
250.0000 mL | Freq: Once | INTRAVENOUS | Status: AC
  Administered 2017-07-21: 250 mL via INTRAVENOUS

## 2017-07-21 MED ORDER — MIDAZOLAM BOLUS VIA INFUSION
2.0000 mg | INTRAVENOUS | Status: DC | PRN
Start: 2017-07-21 — End: 2017-07-22
  Filled 2017-07-21: qty 2

## 2017-07-21 MED ORDER — ACETAMINOPHEN 650 MG RE SUPP
975.0000 mg | Freq: Once | RECTAL | Status: AC
  Administered 2017-07-21: 14:00:00 975 mg via RECTAL
  Filled 2017-07-21: qty 1

## 2017-07-21 MED ORDER — LATANOPROST 0.005 % OP SOLN
1.0000 [drp] | Freq: Every day | OPHTHALMIC | Status: DC
  Administered 2017-07-21: 1 [drp] via OPHTHALMIC
  Filled 2017-07-21: qty 2.5

## 2017-07-21 MED ORDER — PHENYLEPHRINE HCL-NACL 10-0.9 MG/250ML-% IV SOLN
0.0000 ug/min | INTRAVENOUS | Status: DC
  Administered 2017-07-21: 400 ug/min via INTRAVENOUS
  Filled 2017-07-21: qty 250

## 2017-07-21 MED ORDER — ACETAMINOPHEN 650 MG RE SUPP: 650.0000 mg | Freq: Once | RECTAL | Status: DC

## 2017-07-21 MED ORDER — NOREPINEPHRINE 4 MG/250ML-% IV SOLN
0.0000 ug/min | INTRAVENOUS | Status: DC
  Filled 2017-07-21: qty 250

## 2017-07-21 MED ORDER — SODIUM CHLORIDE 0.9 % IV SOLN
3.0000 ug/kg/min | INTRAVENOUS | Status: DC
  Administered 2017-07-21: 3 ug/kg/min via INTRAVENOUS
  Filled 2017-07-21 (×2): qty 20

## 2017-07-21 MED ORDER — DEXTROSE 5 % IV SOLN
1.0000 g | Freq: Once | INTRAVENOUS | Status: AC
  Administered 2017-07-21: 1 g via INTRAVENOUS
  Filled 2017-07-21: qty 10

## 2017-07-21 MED ORDER — ENOXAPARIN SODIUM 40 MG/0.4ML ~~LOC~~ SOLN
40.0000 mg | SUBCUTANEOUS | Status: DC
  Filled 2017-07-21: qty 0.4

## 2017-07-21 MED ORDER — SODIUM CHLORIDE 0.9 % IV BOLUS (SEPSIS)
500.0000 mL | Freq: Once | INTRAVENOUS | Status: AC
  Administered 2017-07-21: 500 mL via INTRAVENOUS

## 2017-07-21 MED ORDER — PHENYLEPHRINE HCL 10 MG/ML IJ SOLN
INTRAMUSCULAR | Status: DC | PRN
  Administered 2017-07-21: 120 ug via INTRAVENOUS
  Administered 2017-07-21: 80 ug via INTRAVENOUS
  Administered 2017-07-21: 120 ug via INTRAVENOUS

## 2017-07-21 MED ORDER — CISATRACURIUM BOLUS VIA INFUSION
0.1000 mg/kg | Freq: Once | INTRAVENOUS | Status: DC
  Filled 2017-07-21: qty 6

## 2017-07-21 MED ORDER — POTASSIUM CHLORIDE 2 MEQ/ML IV SOLN: INTRAVENOUS | Status: DC

## 2017-07-21 MED ORDER — SODIUM CHLORIDE 0.9 % IV SOLN: 250.0000 mL | INTRAVENOUS | Status: DC | PRN

## 2017-07-21 MED ORDER — MIDAZOLAM HCL 2 MG/2ML IJ SOLN
2.0000 mg | Freq: Once | INTRAMUSCULAR | Status: DC | PRN
  Filled 2017-07-21: qty 2

## 2017-07-21 MED ORDER — IPRATROPIUM-ALBUTEROL 0.5-2.5 (3) MG/3ML IN SOLN: 3.0000 mL | Freq: Four times a day (QID) | RESPIRATORY_TRACT | Status: DC

## 2017-07-21 MED ORDER — SODIUM CHLORIDE 0.9 % IV BOLUS (SEPSIS)
1000.0000 mL | Freq: Once | INTRAVENOUS | Status: AC
  Administered 2017-07-21: 1000 mL via INTRAVENOUS

## 2017-07-21 MED ORDER — IBUPROFEN 200 MG PO TABS
400.0000 mg | ORAL_TABLET | Freq: Once | ORAL | Status: DC
  Filled 2017-07-21: qty 2

## 2017-07-21 MED ORDER — OSELTAMIVIR PHOSPHATE 6 MG/ML PO SUSR
30.0000 mg | Freq: Two times a day (BID) | ORAL | Status: DC
  Filled 2017-07-21: qty 5

## 2017-07-21 MED ORDER — POTASSIUM CHLORIDE 2 MEQ/ML IV SOLN
INTRAVENOUS | Status: DC
  Administered 2017-07-21: 18:00:00 via INTRAVENOUS
  Filled 2017-07-21 (×2): qty 1000

## 2017-07-21 MED ORDER — FENTANYL CITRATE (PF) 100 MCG/2ML IJ SOLN: 100.0000 ug | Freq: Once | INTRAMUSCULAR | Status: DC

## 2017-07-21 MED ORDER — PROPOFOL 10 MG/ML IV BOLUS
INTRAVENOUS | Status: DC | PRN
  Administered 2017-07-21: 80 mg via INTRAVENOUS

## 2017-07-21 MED ORDER — LACTATED RINGERS IV BOLUS (SEPSIS): 2000.0000 mL | Freq: Once | INTRAVENOUS | Status: DC

## 2017-07-21 MED ORDER — CHLORHEXIDINE GLUCONATE 0.12% ORAL RINSE (MEDLINE KIT): 15.0000 mL | Freq: Two times a day (BID) | OROMUCOSAL | Status: DC

## 2017-07-21 MED ORDER — ACETAMINOPHEN 325 MG PO TABS: 650.0000 mg | ORAL_TABLET | ORAL | Status: DC | PRN

## 2017-07-21 MED ORDER — ACETAMINOPHEN 160 MG/5ML PO SOLN: 650.0000 mg | Freq: Four times a day (QID) | ORAL | Status: DC | PRN

## 2017-07-21 MED ORDER — ORAL CARE MOUTH RINSE: 15.0000 mL | Freq: Four times a day (QID) | OROMUCOSAL | Status: DC

## 2017-07-21 MED ORDER — PANTOPRAZOLE SODIUM 40 MG IV SOLR
40.0000 mg | Freq: Every day | INTRAVENOUS | Status: DC
  Filled 2017-07-21: qty 40

## 2017-07-21 MED ORDER — PHENYLEPHRINE HCL-NACL 10-0.9 MG/250ML-% IV SOLN
INTRAVENOUS | Status: AC
  Administered 2017-07-21: 10 mg
  Filled 2017-07-21: qty 250

## 2017-07-21 MED ORDER — DEXTROSE 5 % IV SOLN
1.0000 g | Freq: Two times a day (BID) | INTRAVENOUS | Status: DC
  Administered 2017-07-21: 1 g via INTRAVENOUS
  Filled 2017-07-21 (×2): qty 10

## 2017-07-21 MED ORDER — FENTANYL CITRATE (PF) 2500 MCG/50ML IJ SOLN
0.0000 ug/h | INTRAMUSCULAR | Status: DC
  Administered 2017-07-21: 25 ug/h via INTRAVENOUS
  Filled 2017-07-21: qty 50

## 2017-07-21 MED ORDER — FENTANYL BOLUS VIA INFUSION
50.0000 ug | INTRAVENOUS | Status: DC | PRN
Start: 2017-07-21 — End: 2017-07-22
  Administered 2017-07-21: 50 ug via INTRAVENOUS
  Filled 2017-07-21: qty 50

## 2017-07-21 MED ORDER — ARTIFICIAL TEARS OPHTHALMIC OINT
1.0000 "application " | TOPICAL_OINTMENT | Freq: Three times a day (TID) | OPHTHALMIC | Status: DC
  Administered 2017-07-21: 1 via OPHTHALMIC
  Filled 2017-07-21: qty 3.5

## 2017-07-21 MED ORDER — DEXTROSE 5 % IV SOLN: 500.0000 mg | INTRAVENOUS | Status: DC

## 2017-07-21 MED ORDER — DEXTROSE 5 % IV SOLN
500.0000 mg | Freq: Once | INTRAVENOUS | Status: AC
  Administered 2017-07-21: 500 mg via INTRAVENOUS
  Filled 2017-07-21: qty 500

## 2017-07-21 MED ORDER — FENTANYL CITRATE (PF) 100 MCG/2ML IJ SOLN: 100.0000 ug | Freq: Once | INTRAMUSCULAR | Status: DC | PRN

## 2017-07-21 MED ORDER — DEXTROSE 5 % IV SOLN: 1.0000 g | INTRAVENOUS | Status: DC

## 2017-07-22 LAB — RESPIRATORY PANEL BY PCR
Adenovirus: NOT DETECTED
BORDETELLA PERTUSSIS-RVPCR: NOT DETECTED
CHLAMYDOPHILA PNEUMONIAE-RVPPCR: NOT DETECTED
Coronavirus 229E: NOT DETECTED
Coronavirus HKU1: NOT DETECTED
Coronavirus NL63: NOT DETECTED
Coronavirus OC43: NOT DETECTED
INFLUENZA B-RVPPCR: NOT DETECTED
Influenza A H3: DETECTED — AB
METAPNEUMOVIRUS-RVPPCR: NOT DETECTED
MYCOPLASMA PNEUMONIAE-RVPPCR: NOT DETECTED
Parainfluenza Virus 1: NOT DETECTED
Parainfluenza Virus 2: NOT DETECTED
Parainfluenza Virus 3: NOT DETECTED
Parainfluenza Virus 4: NOT DETECTED
RESPIRATORY SYNCYTIAL VIRUS-RVPPCR: NOT DETECTED
Rhinovirus / Enterovirus: NOT DETECTED

## 2017-07-22 LAB — URINE CULTURE: Culture: NO GROWTH

## 2017-07-22 LAB — STREP PNEUMONIAE URINARY ANTIGEN: Strep Pneumo Urinary Antigen: NEGATIVE

## 2017-07-22 NOTE — Progress Notes (Signed)
Pt expired at 2215 verifired by two nurses. Su MonksMelissa Ninfa Giannelli RN and Oren BeckmannGenell Garland RN. No heart sounds Ausculted. Pt family at bedside. Declined chaplain and autopsy. Pt not suitable for donation per [place] donor services.

## 2017-07-23 LAB — LEGIONELLA PNEUMOPHILA SEROGP 1 UR AG: L. pneumophila Serogp 1 Ur Ag: NEGATIVE

## 2017-07-26 ENCOUNTER — Telehealth: Payer: Self-pay

## 2017-07-26 LAB — CULTURE, BLOOD (ROUTINE X 2)
CULTURE: NO GROWTH
Culture: NO GROWTH
SPECIAL REQUESTS: ADEQUATE
Special Requests: ADEQUATE

## 2017-07-26 NOTE — Telephone Encounter (Signed)
On 07/26/17 I received a d/c from [employer] (original). The d/c is for burial. The patient is a patient of Doctor Ramasway. The d/c will be taken to Pulmonary Unit @ [facility] for signature.  On 07/30/17 I received the d/c back from Doctor Ramaswamy. I got the d/c ready and called the funeral home to let them know the d/c is ready for pickup.

## 2017-07-31 ENCOUNTER — Ambulatory Visit: Payer: Medicare Other | Admitting: Podiatry

## 2017-08-01 ENCOUNTER — Encounter: Payer: Self-pay | Admitting: Physician Assistant

## 2017-08-08 NOTE — Discharge Summary (Signed)
PULMONARY / CRITICAL CARE MEDICINE   Name: Melissa Davis MRN: [id number] DOB: 10-28-1940    ADMISSION DATE:  08/01/2017 DATE OF DEATH 07/22/2017   FINAL CAUSE OF DEATH: Community acquired pneumonia  SECONDARY CAUSES OF DEATH: Acute respiratory failure Metabolic encephalopathy Severe sepsis Metabolic acidosis Acute renal failure Lactic acidosis Septic shock Diabetes Dementia Hypertension Neutropenia     HISTORY OF PRESENT ILLNESS / hospital course:   77 year old woman with dementia, diabetes, hypertension, possible seizure disorder.  She was admitted with lethargy, encephalopathy and hypoxemia.  She is placed on BiPAP for respiratory support.  Evaluation consistent with neutropenia and probable community-acquired pneumonia.  She was treated with antibiotics. Despite these interventions she experienced progress resp failure and was intubated. Experienced progressive septic shock. Despite full support she continued to decline, experienced progressive shock, and expired early 07/22/17.    CULTURES: Blood cultures and respiratory PCR sent 1/5  ANTIBIOTICS: Rocephin and azithromycin  SIGNIFICANT EVENTS:  LINES/TUBES: none   Levy Pupaobert Elmond Poehlman, MD, PhD 08/08/2017, 9:47 PM [facility] [phone] or if no answer [phone]

## 2017-08-17 NOTE — ED Notes (Addendum)
Date and time results received: 14-Mar-2018 1400 (use smartphrase ".now" to insert current time)  Test: WBC Critical Value: 1.3  Name of Provider Notified: Haviland,MD

## 2017-08-17 NOTE — Progress Notes (Addendum)
Notified MD Wallace CullensGray in regards to patient's RR at 40-45 while on ventilator. MD Wallace CullensGray ordered to place and give fentanyl infusion in. Also made MD Wallace CullensGray aware of blood tinged sputum when suctioning. Will continue to monitor and assess. Updated daughter at bedside.

## 2017-08-17 NOTE — H&P (Signed)
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 She was last seen in the [facility] emergency room on 1/2 at which time she was started on Depakote in the event that she might be having absencespells. She was discharged home. For Approximately the past 24 Hours she hashad a decreased Level of consciousness and she was Brought to [place] [facility] Emergency [facility] Where she was  febrile and Desaturating into the 70s on Room Air. She Was Placed on a nonrebreather and her Sats only came up to 75%. She Subsequently has been placed on BiPAP.The patient states that her breathing is better on BiPAP. She denies a productive cough. Family denies sick contacts although 1 family members is coughing. The patient has not noted fevers, chills, sweats, or cough at home. She has no history of chronic lung disease.  PAST MEDICAL HISTORY :  She  has a past medical history of Depression, Falls, GERD (gastroesophageal reflux disease), Hyperlipidemia, Hypertension, Memory loss, Type II or unspecified type diabetes mellitus without mention of complication, not stated as uncontrolled, and Vitamin D deficiency.  PAST SURGICAL HISTORY: She  has a past surgical history that includes Cholecystectomy and Total abdominal hysterectomy w/ bilateral salpingoophorectomy (1990).  Allergies  Allergen Reactions  . Buspirone   . Clonidine Derivatives   . Cymbalta [Duloxetine Hcl]   . Elavil [Amitriptyline]   . Hytrin [Terazosin]   . Nizofenone   . Paxil [Paroxetine Hcl]   . Prozac [Fluoxetine Hcl]   . Zoloft [Sertraline Hcl]     No current facility-administered medications on file prior to encounter.    Current Outpatient Medications on File Prior to Encounter  Medication  Sig  . ALPRAZolam (XANAX) 0.5 MG tablet TAKE ONE-HALF TO 1 TABLET BY MOUTH THREE TIMES DAILY ONLY AS NEEDED FOR ANXIETY  . amLODipine (NORVASC) 10 MG tablet TAKE 1 TABLET(10 MG) BY MOUTH DAILY  . bisoprolol (ZEBETA) 5 MG tablet TAKE 1 TABLET BY MOUTH DAILY  . Blood Glucose Monitoring Suppl (ONE TOUCH ULTRA SYSTEM KIT) W/DEVICE KIT 1 kit by Does not apply route once. Check glucose 1 time daily-DX-E11.2  . dextromethorphan-guaiFENesin (MUCINEX DM) 30-600 MG 12hr tablet Take 1 tablet by mouth 2 (two) times daily.  . divalproex (DEPAKOTE ER) 500 MG 24 hr tablet Take 1 tablet (500 mg total) by mouth at bedtime.  Marland Kitchen glucose blood (ONE TOUCH ULTRA TEST) test strip Check glucose 1 time daily-DX-E11.2  . Lancets (ONETOUCH ULTRASOFT) lancets Check glucose 1 time daily-DX-E11.2  . memantine (NAMENDA) 10 MG tablet Take 1 tablet (10 mg total) by mouth 2 (two) times daily.  . metFORMIN (GLUCOPHAGE-XR) 500 MG 24 hr tablet TAKE 1 TABLET BY MOUTH DAILY OR AS DIRECTED FOR DIABETES  . quinapril (ACCUPRIL) 40 MG tablet TAKE 1 TABLET(40 MG) BY MOUTH DAILY  . TRAVATAN Z 0.004 % SOLN ophthalmic solution Place 1 drop into both eyes at bedtime.   Marland Kitchen VITAMIN D, ERGOCALCIFEROL, PO Take 1 tablet by mouth daily. Strength unknown  . donepezil (ARICEPT) 10 MG tablet Take 1 tablet (10 mg total) by mouth at bedtime.  . metFORMIN (GLUCOPHAGE-XR) 500 MG 24 hr tablet TAKE 4 TABLETS BY MOUTH DAILY OR AS DIRECTED FOR DIABETES (Patient not taking: Reported on August 06, 2017)  FAMILY HISTORY:  Her indicated that her mother is deceased. She indicated that her father is deceased. She indicated that her maternal grandmother is deceased. She indicated that her maternal grandfather is deceased. She indicated that her paternal grandmother is deceased. She indicated that her paternal grandfather is deceased. She indicated that the status of her son is unknown.   SOCIAL HISTORY: She  reports that  has never smoked. she has never used smokeless  tobacco. She reports that she does not drink alcohol or use drugs.  REVIEW OF SYSTEMS:   At baseline she does her own ADLs. She has some short-term memory loss but is otherwise appropriate. She has no known prior history of CVA or TIA. There is a suspicion of Seizures As Noted. She has no known history of respiratory illness, no known history of cardiac disease including chest pain arrhythmias syncope. She has no history of GI bleeding or known liver disease.she does have type 2 diabetes. SUBJECTIVE:  As above.  VITAL SIGNS: BP 109/70   Pulse (!) 110   Temp (!) 102.4 F (39.1 C) (Rectal)   Resp (!) 33   SpO2 100%   HEMODYNAMICS:    VENTILATOR SETTINGS: Vent Mode: BIPAP FiO2 (%):  [100 %] 100 % Set Rate:  [10 bmp] 10 bmp PEEP:  [8 cmH20] 8 cmH20  INTAKE / OUTPUT: No intake/output data recorded.  PHYSICAL EXAMINATION: General:  Frail elderly-appearing female who is not distressed on BiPAP. Neuro:  She interacts primarily by nodding.she is appropriate. She follows simple instructions. Pupils are equal. Face is symmetric. She moves all fours on request. Cardiovascular:  She has no JVD. She has no carotid bruits. S1 and S2 are regular without murmur rub or gallop. Lungs:  Respirations are unlabored on BiPAP. There are scattered rhonchi most prominent at the right base.There are no wheezes at present. Abdomen:  Abdomen is flat and soft without any organomegaly or masses tenderness guarding or rebound. Musculoskeletal: no dependent edema.   LABS:  BMET Recent Labs  Lab 07/18/17 1020 08/17/17 1325  NA 137 138  K 3.5 3.2*  CL 104 107  CO2 24 20*  BUN 18 31*  CREATININE 0.76 1.27*  GLUCOSE 123* 166*    Electrolytes Recent Labs  Lab 07/18/17 1020 2017-08-17 1325  CALCIUM 9.0 8.1*    CBC Recent Labs  Lab 07/18/17 1020 17-Aug-2017 1325  WBC 7.5 1.3*  HGB 12.9 13.5  HCT 39.3 39.9  PLT 234 174    Coag's No results for input(s): APTT, INR in the last 168  hours.  Sepsis Markers Recent Labs  Lab 08-17-2017 1336  LATICACIDVEN 4.29*    ABG Recent Labs  Lab 08-17-2017 1355  PHART 7.313*  PCO2ART 42.7  PO2ART 105    Liver Enzymes Recent Labs  Lab 07/18/17 1020 2017-08-17 1325  AST 38 57*  ALT 34 31  ALKPHOS 86 47  BILITOT 0.7 1.2  ALBUMIN 3.9 2.8*    Cardiac Enzymes Recent Labs  Lab 08-17-2017 1325  TROPONINI 0.04*    Glucose No results for input(s): GLUCAP in the last 168 hours.  Imaging Dg Chest Port 1 View  Result Date: 08-17-2017 CLINICAL DATA:  Shortness of breath, fever EXAM: PORTABLE CHEST 1 VIEW COMPARISON:  02/27/2017 FINDINGS: Multifocal patchy opacities in the bilateral lower lobes and left upper lobe/ perihilar region. Differential considerations include multifocal pneumonia (favored) versus interstitial edema. Suspected small left pleural effusion.  No pneumothorax. The heart is normal in size. IMPRESSION: Multifocal patchy opacities, favoring  multifocal pneumonia, lower lobe predominant. Interstitial edema is considered less likely. Small left pleural effusion. Electronically Signed   By: Julian Hy M.D.   On: 07/29/17 13:51       CULTURES: Blood cultures and respiratory PCR sent 1/5  ANTIBIOTICS: Rocephin and azithromycin  SIGNIFICANT EVENTS:  LINES/TUBES: none  DISCUSSION:   Is a 76 mild dementia who was recently been having falling episodes. She was last seen in the cold emergency room warm to at which time it was suspected she may be having Spells Accounting for Her Frequent Falls and She Was Started on Depakote. She Returned Home and Ever since 3 PM Yesterday Is Been Extremely Lethargic. She Was Brought toward Department of Emergency Medicine Where She Was Found to Have Saturation in the 70s on Room Air and Only 75% on Nonrebreather. Chest X-Ray Showed Diffuse Infiltrates Most Prominent at the Right Base. Of Note She Is Also Substantially Neutropenic.  ASSESSMENT / PLAN:  PULMONARY A:  unfortunately of constellation of fever altered mental status pulmonary infiltrate and depressed white count all point to severe sepsis from pneumonia. I have covered her for community cord process with azithromycin and Rocephin. I'm very concerned by her initial blood gas which showed an inability to Sit for a mild metabolic acidosis.I'm repeating the gas on BiPAP but still looks as though she cannot compensate she will require intubation. I discussed this contingency with family who think that it would be appropriate.  RENAL A: creatinine has risen since 1/2, she is being aggressively hydrated.   GASTROINTESTINAL A: GI prophylaxis with Protonix.   HEMATOLOGIC A: I am concerned by her new neutropenia. This is either secondary to overwhelming sepsis of the Depakote that was recently introduced. I have discontinued the Depakote.  INFECTIOUS A:  Community-acquired pneumonia with sepsis manifested as hypotension on admission and lactic acidosis along with a depressed white count. Repeat lactates have been ordered to assess the adequacy of resuscitation.  ENDOCRINE A: Type2 diabetes. 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 Suspicion of seizures.     Greater than 35 minutes was spent in the care of this patient.  Lars Masson, MD Pulmonary and [place] Pager: [phone]  Jul 29, 2017, 3:56 PM

## 2017-08-17 NOTE — Anesthesia Preprocedure Evaluation (Signed)
Anesthesia Evaluation  Patient identified by MRN, date of birth, ID band Patient confused    Reviewed: Allergy & Precautions, NPO status , Patient's Chart, lab work & pertinent test results  Airway Mallampati: II       Dental  (+) Teeth Intact   Pulmonary     + decreased breath sounds      Cardiovascular hypertension,  Rhythm:Regular Rate:Tachycardia     Neuro/Psych    GI/Hepatic   Endo/Other  diabetes  Renal/GU      Musculoskeletal   Abdominal   Peds  Hematology   Anesthesia Other Findings   Reproductive/Obstetrics                             Anesthesia Physical Anesthesia Plan  ASA: IV  Anesthesia Plan:    Post-op Pain Management:    Induction: Intravenous  PONV Risk Score and Plan:   Airway Management Planned: Oral ETT  Additional Equipment:   Intra-op Plan:   Post-operative Plan:   Informed Consent:   Plan Discussed with:   Anesthesia Plan Comments: (Respiratory failure secondary to influenza A.)        Anesthesia Quick Evaluation

## 2017-08-17 NOTE — Progress Notes (Signed)
Patient hypotensive after intubation, anesthesia at bedside. Notified MD Wallace CullensGray in regards to patient receiving neosynephrine pushes and infusion being started. MD Wallace CullensGray order to place levophed gtt order and to give 1 liter bolus.

## 2017-08-17 NOTE — Progress Notes (Signed)
Spoke to bedside nurse while talking abot other patients. This patient is now on ccm service. I am at remote location Will order 2L LR bols and check renal function Will order full dose tamiflu given how sick she is; if renal function gets worse then we can back off on dosing Will check urine strep/leg Will track lactate  Dr. Kalman ShanMurali Jubal Rademaker, M.D., [facility]C.P Pulmonary and Critical Care Medicine Staff Physician, [facility] Director - Interstitial Lung Disease  Program  Pulmonary Fibrosis [facility] [place], [place], [id number]  Pager: [phone], If no answer or between  15:00h - 7:00h: call [phone] Telephone: [phone]

## 2017-08-17 NOTE — Progress Notes (Signed)
PCCM Interval Note  Goals of care conversation with daughter/POA. Family understands that patient is critical and on max support at this time with no improvement. Family understands and voices that patient would not want to be on life supporting measures and would not want to undergo CPR.   Code status updated in EMR in DNR with no further escalation of care. Waiting for additional family to arrive before decision of withdrawal to be made.   Melissa Davis, AGACNP-BC [facility]  Pgr: [phone]  PCCM Pgr: [phone]

## 2017-08-17 NOTE — ED Provider Notes (Signed)
[facility] DEPT Provider Note   CSN: [id number] Arrival date & time: Jul 24, 2017  1309     History   Chief Complaint Chief Complaint  Patient presents with  . Altered Mental Status    Possible sepsis    HPI Melissa Davis is a 77 y.o. female.  Pt presents to the ED today with fever and altered mental status.  The pt was in the ED on 1/2 for a fall.  She was evaluated and everything was ok, so she was d/c home with her daughter.  Since then, she has been getting worse.  Pt has been somnolent and difficult to arouse while at home.  EMS said she felt hot and had a fever of 101.1.  O2 sats in the low 70s and only went up to 78% on 100% NRB.  The pt is unable to give any hx.      Past Medical History:  Diagnosis Date  . Depression   . Falls   . GERD (gastroesophageal reflux disease)   . Hyperlipidemia   . Hypertension   . Memory loss   . Type II or unspecified type diabetes mellitus without mention of complication, not stated as uncontrolled   . Vitamin D deficiency     Patient Active Problem List   Diagnosis Date Noted  . Mild cognitive impairment 06/28/2017  . Passed out [facility]) 06/28/2017  . Syncope 03/16/2017  . Atherosclerosis of aorta ([place]) 02/27/2017  . Body mass index (BMI) of 20.0-20.9 in adult 04/22/2015  . Osteopenia 07/03/2014  . Medication management 09/26/2013  . Glaucoma 09/26/2013  . Prediabetes   . Hyperlipidemia   . Hypertension   . Vitamin D deficiency   . GERD (gastroesophageal reflux disease)   . Depression, major, in remission [facility])     Past Surgical History:  Procedure Laterality Date  . CHOLECYSTECTOMY    . TOTAL ABDOMINAL HYSTERECTOMY W/ BILATERAL SALPINGOOPHORECTOMY  1990    OB History    No data available       Home Medications    Prior to Admission medications   Medication Sig Start Date End Date Taking? Authorizing Provider  ALPRAZolam (XANAX) 0.5 MG tablet TAKE ONE-HALF TO 1 TABLET BY MOUTH THREE  TIMES DAILY ONLY AS NEEDED FOR ANXIETY 06/10/17   Unk Pinto, MD  amLODipine (NORVASC) 10 MG tablet TAKE 1 TABLET(10 MG) BY MOUTH DAILY 01/06/17   Unk Pinto, MD  bisoprolol (ZEBETA) 5 MG tablet TAKE 1 TABLET BY MOUTH DAILY 07/06/17   Unk Pinto, MD  Blood Glucose Monitoring Suppl (ONE TOUCH ULTRA SYSTEM KIT) W/DEVICE KIT 1 kit by Does not apply route once. Check glucose 1 time daily-DX-E11.2 12/30/14   Unk Pinto, MD  divalproex (DEPAKOTE ER) 500 MG 24 hr tablet Take 1 tablet (500 mg total) by mouth at bedtime. 07/19/17   Marcial Pacas, MD  donepezil (ARICEPT) 10 MG tablet Take 1 tablet (10 mg total) by mouth at bedtime. 06/28/17   Marcial Pacas, MD  glucose blood (ONE TOUCH ULTRA TEST) test strip Check glucose 1 time daily-DX-E11.2 02/15/16   Unk Pinto, MD  Lancets [facility] ULTRASOFT) lancets Check glucose 1 time daily-DX-E11.2 12/30/14   Unk Pinto, MD  memantine (NAMENDA) 10 MG tablet Take 1 tablet (10 mg total) by mouth 2 (two) times daily. 06/28/17   Marcial Pacas, MD  metFORMIN (GLUCOPHAGE-XR) 500 MG 24 hr tablet TAKE 1 TABLET BY MOUTH DAILY OR AS DIRECTED FOR DIABETES 02/15/17   Vicie Mutters, PA-C  metFORMIN (  GLUCOPHAGE-XR) 500 MG 24 hr tablet TAKE 4 TABLETS BY MOUTH DAILY OR AS DIRECTED FOR DIABETES 07/05/17   Vicie Mutters, PA-C  quinapril (ACCUPRIL) 40 MG tablet TAKE 1 TABLET(40 MG) BY MOUTH DAILY 04/03/17   Unk Pinto, MD  TRAVATAN Z 0.004 % SOLN ophthalmic solution Place 1 drop into both eyes at bedtime.  05/07/13   [provider]    Family History Family History  Problem Relation Age of Onset  . Hypertension Mother   . Heart attack Mother   . Ulcers Mother   . CVA Father   . Alzheimer's disease Father   . Hypertension Father     Social History Social History   Tobacco Use  . Smoking status: Never Smoker  . Smokeless tobacco: Never Used  Substance Use Topics  . Alcohol use: No  . Drug use: No     Allergies   Buspirone;  Clonidine derivatives; Cymbalta [duloxetine hcl]; Elavil [amitriptyline]; Hytrin [terazosin]; Nizofenone; Paxil [paroxetine hcl]; Prozac [fluoxetine hcl]; and Zoloft [sertraline hcl]   Review of Systems Review of Systems  Unable to perform ROS: Mental status change  All other systems reviewed and are negative.    Physical Exam Updated Vital Signs BP (!) 122/46   Pulse (!) 110   Temp (!) 102.4 F (39.1 C) (Rectal)   Resp (!) 47   SpO2 100%   Physical Exam  Constitutional: She appears well-developed. She appears distressed.  HENT:  Head: Normocephalic and atraumatic.  Right Ear: External ear normal.  Left Ear: External ear normal.  Nose: Nose normal.  Mouth/Throat: Mucous membranes are dry.  Eyes: Conjunctivae and EOM are normal. Pupils are equal, round, and reactive to light.  Neck: Normal range of motion. Neck supple.  Cardiovascular: Regular rhythm and normal heart sounds. Tachycardia present.  Pulmonary/Chest: Accessory muscle usage present. Tachypnea noted. She is in respiratory distress.  Abdominal: Soft. Bowel sounds are normal.  Musculoskeletal: Normal range of motion.  Neurological: She is alert.  Pt is awake, but not talking.  Skin: Skin is warm. Capillary refill takes less than 2 seconds.  Nursing note and vitals reviewed.    ED Treatments / Results  Labs (all labs ordered are listed, but only abnormal results are displayed) Labs Reviewed  COMPREHENSIVE METABOLIC PANEL - Abnormal; Notable for the following components:      Result Value   Potassium 3.2 (*)    CO2 20 (*)    Glucose, Bld 166 (*)    BUN 31 (*)    Creatinine, Ser 1.27 (*)    Calcium 8.1 (*)    Albumin 2.8 (*)    AST 57 (*)    GFR calc non Af Amer 40 (*)    GFR calc Af Amer 47 (*)    All other components within normal limits  CBC WITH DIFFERENTIAL/PLATELET - Abnormal; Notable for the following components:   WBC 1.3 (*)    Neutro Abs 0.8 (*)    Lymphs Abs 0.4 (*)    All other components  within normal limits  URINALYSIS, ROUTINE W REFLEX MICROSCOPIC - Abnormal; Notable for the following components:   Glucose, UA 50 (*)    Hgb urine dipstick SMALL (*)    Ketones, ur 20 (*)    Protein, ur 100 (*)    Bacteria, UA FEW (*)    All other components within normal limits  TROPONIN I - Abnormal; Notable for the following components:   Troponin I 0.04 (*)    All other components  within normal limits  BLOOD GAS, ARTERIAL - Abnormal; Notable for the following components:   pH, Arterial 7.313 (*)    Acid-base deficit 4.3 (*)    All other components within normal limits  I-STAT CG4 LACTIC ACID, ED - Abnormal; Notable for the following components:   Lactic Acid, Venous 4.29 (*)    All other components within normal limits  CULTURE, BLOOD (ROUTINE X 2)  CULTURE, BLOOD (ROUTINE X 2)  URINE CULTURE  INFLUENZA PANEL BY PCR (TYPE A & B)  I-STAT CG4 LACTIC ACID, ED    EKG  EKG Interpretation  Date/Time:  08/09/17 13:29:40 EST Ventricular Rate:  122 PR Interval:    QRS Duration: 89 QT Interval:  339 QTC Calculation: 479 R Axis:   83 Text Interpretation:  Sinus tachycardia Borderline right axis deviation Borderline repolarization abnormality Since last tracing rate faster Baseline wander Confirmed by Isla Pence [phone]) on 08/09/17 1:49:38 PM       Radiology Dg Chest Port 1 View  Result Date: August 09, 2017 CLINICAL DATA:  Shortness of breath, fever EXAM: PORTABLE CHEST 1 VIEW COMPARISON:  02/27/2017 FINDINGS: Multifocal patchy opacities in the bilateral lower lobes and left upper lobe/ perihilar region. Differential considerations include multifocal pneumonia (favored) versus interstitial edema. Suspected small left pleural effusion.  No pneumothorax. The heart is normal in size. IMPRESSION: Multifocal patchy opacities, favoring multifocal pneumonia, lower lobe predominant. Interstitial edema is considered less likely. Small left pleural effusion. Electronically  Signed   By: Julian Hy M.D.   On: August 09, 2017 13:51    Procedures Procedures (including critical care time)  Medications Ordered in ED Medications  azithromycin (ZITHROMAX) 500 mg in dextrose 5 % 250 mL IVPB (500 mg Intravenous New Bag/Given 2017/08/09 1402)  ibuprofen (ADVIL,MOTRIN) tablet 400 mg (0 mg Oral Hold 2017/08/09 1439)  sodium chloride 0.9 % bolus 1,000 mL (0 mLs Intravenous Stopped 2017/08/09 1432)    And  sodium chloride 0.9 % bolus 500 mL (0 mLs Intravenous Stopped 2017/08/09 1432)    And  sodium chloride 0.9 % bolus 250 mL (250 mLs Intravenous New Bag/Given 08-09-2017 1432)  cefTRIAXone (ROCEPHIN) 1 g in dextrose 5 % 50 mL IVPB (0 g Intravenous Stopped 08-09-17 1410)  acetaminophen (TYLENOL) suppository 975 mg (975 mg Rectal Given 2017-08-09 1340)     Initial Impression / Assessment and Plan / ED Course  I have reviewed the triage vital signs and the nursing notes.  Pertinent labs & imaging results that were available during my care of the patient were reviewed by me and considered in my medical decision making (see chart for details).    CRITICAL CARE Performed by: Isla Pence   Total critical care time: 60 minutes  Critical care time was exclusive of separately billable procedures and treating other patients.  Critical care was necessary to treat or prevent imminent or life-threatening deterioration.  Critical care was time spent personally by me on the following activities: development of treatment plan with patient and/or surrogate as well as nursing, discussions with consultants, evaluation of patient's response to treatment, examination of patient, obtaining history from patient or surrogate, ordering and performing treatments and interventions, ordering and review of laboratory studies, ordering and review of radiographic studies, pulse oximetry and re-evaluation of patient's condition.  Pt placed on bipap immediately upon arrival.  She looks improved on bipap.  Code sepsis  called and pt given sepsis  IVFs and rocephin/zithromax. HR and RR still elevated, but bp is stable.  Pt d/w  Dr. Doyle Askew (triad) who requested I speak to CCM. I spoke with Dr. Pearline Cables who feels she should go to the ICU.  Daughter said it is ok for intubation in situation is possibly reversible.  Final Clinical Impressions(s) / ED Diagnoses   Final diagnoses:  Multifocal pneumonia  Sepsis, due to unspecified organism [facility])  Acute metabolic encephalopathy  AKI (acute kidney injury) ([place])  Fever, unspecified fever cause    ED Discharge Orders    None       Isla Pence, MD August 03, 2017 1448

## 2017-08-17 NOTE — Progress Notes (Addendum)
Patient was admitted to ICU at 1700. Patient's respiratory rate was between 45-52 RR/min. Patient was on BIPAP at 100. Notified MD Wallace CullensGray with CCM in regards to patient needing to be intubated because of declining respiratory status, patient also using accessory muscles. MD Wallace CullensGray order to call Anesthesia to intubate. MD Hart RochesterHollis, Anesthesiologist, came to intubate at bedside.

## 2017-08-17 NOTE — Telephone Encounter (Signed)
Daughter called re: AMS/lethargy.  Pt had a fall 3 d ago, taken to ER, CTH neg for acute changes.  Has taken one dose of VPA 500 mg too nights ago, has been very sleepy, hard to arouse. Daughter held last night's dose and was advised by me to hold further doses until able to d/w Dr. Terrace ArabiaYan. Daughter was advised to try to push oral fluids or Boost or Ensure but if unable to arouse patient enough to go to the bathroom and eat/drink, will have to call 911. Pt at risk for dehydration, low BP etc. She demonstrated understanding and agreement.

## 2017-08-17 NOTE — Anesthesia Procedure Notes (Signed)
Procedure Name: Intubation Date/Time: 2017/08/08 5:35 PM Performed by: Effie Berkshire, MD Pre-anesthesia Checklist: Patient identified, Patient being monitored, Timeout performed, Emergency Drugs available and Suction available Patient Re-evaluated:Patient Re-evaluated prior to induction Oxygen Delivery Method: Circle system utilized Preoxygenation: Pre-oxygenation with 100% oxygen Induction Type: IV induction, Rapid sequence and Cricoid Pressure applied Laryngoscope Size: Mac and 4 Grade View: Grade I Tube type: Oral Tube size: 7.5 mm Number of attempts: 1 Airway Equipment and Method: Stylet Placement Confirmation: ETT inserted through vocal cords under direct vision,  positive ETCO2 and breath sounds checked- equal and bilateral Secured at: 21 cm Tube secured with: Tape Dental Injury: Teeth and Oropharynx as per pre-operative assessment

## 2017-08-17 NOTE — ED Triage Notes (Signed)
Patient Arrived via [facility] coming from home for altered mental status and lethargic. Breathing 50-60 times a minute. Last seen normal at 2:30 yesterday afternoon. Fail on Wednesday. Been home with daughter. Hot to touch. Fever of 101.1.

## 2017-08-17 NOTE — Anesthesia Postprocedure Evaluation (Signed)
Anesthesia Post Note  Patient: Melissa Davis  Procedure(s) Performed: AN AD HOC INTUBATION     Patient location during evaluation: SICU Anesthesia Type: General Level of consciousness: sedated Pain management: pain level controlled Vital Signs Assessment: post-procedure vital signs reviewed and stable Respiratory status: patient remains intubated per anesthesia plan Cardiovascular status: stable Postop Assessment: no apparent nausea or vomiting Anesthetic complications: no    Last Vitals:  Vitals:   11/24/2017 1600 11/24/2017 1615  BP: (!) 139/98 123/70  Pulse: (!) 128 (!) 123  Resp: (!) 44 (!) 45  Temp:    SpO2: 91% 95%    Last Pain:  Vitals:   11/24/2017 1436  TempSrc: Rectal                 Shelton SilvasKevin D Loye Vento

## 2017-08-17 NOTE — Procedures (Signed)
Central Venous Catheter Insertion Procedure Note Jaclyn ShaggyMary F Ratliff [id number] [birth year]  Procedure: Insertion of Central Venous Catheter Indications: Assessment of intravascular volume, Drug and/or fluid administration and Frequent blood sampling  Procedure Details Consent: Risks of procedure as well as the alternatives and risks of each were explained to the (patient/caregiver).  Consent for procedure obtained. Time Out: Verified patient identification, verified procedure, site/side was marked, verified correct patient position, special equipment/implants available, medications/allergies/relevent history reviewed, required imaging and test results available.  Performed  Maximum sterile technique was used including antiseptics, cap, gloves, gown, hand hygiene, mask and sheet. Skin prep: Chlorhexidine; local anesthetic administered A antimicrobial bonded/coated triple lumen catheter was placed in the right femoral vein due to emergent situation using the Seldinger technique.  Evaluation Blood flow good Complications: No apparent complications Patient did tolerate procedure well.   Jovita KussmaulKatalina Jadamarie Butson, AGACNP-BC [facility]  Pgr: [phone]  PCCM Pgr: [phone]

## 2017-08-17 NOTE — ED Notes (Signed)
Date and time results received: 12-14-17 1400 (use smartphrase ".now" to insert current time)  Test: Lactic Acid Critical Value: 4.29  Name of Provider Notified: [place],MD

## 2017-08-17 NOTE — Procedures (Signed)
Arterial Catheter Insertion Procedure Note Jaclyn ShaggyMary F Barnhardt [id number] 1940/12/23  Procedure: Insertion of Arterial Catheter  Indications: Blood pressure monitoring and Frequent blood sampling  Procedure Details Consent: Risks of procedure as well as the alternatives and risks of each were explained to the (patient/caregiver).  Consent for procedure obtained. Time Out: Verified patient identification, verified procedure, site/side was marked, verified correct patient position, special equipment/implants available, medications/allergies/relevent history reviewed, required imaging and test results available.  Performed  Maximum sterile technique was used including antiseptics, cap, gloves, gown, hand hygiene, mask and sheet. Skin prep: Chlorhexidine; local anesthetic administered 22 gauge catheter was inserted into right femoral artery using the Seldinger technique.  Evaluation Blood flow good; BP tracing good. Complications: No apparent complications.   Jovita KussmaulKatalina Alayziah Tangeman, AGACNP-BC [facility]  Pgr: [phone]  PCCM Pgr: [phone]

## 2017-08-17 NOTE — Progress Notes (Signed)
I have seen and examined patient at the bedside. Daughter also present and provided history. Daughter explained that moderate was sick for several days with cough and progressively worsening lethargy. She has also noted that mother was breathing faster, had difficulty moving around, poor oral intake and progressive decline.   In emergency department, patient somnolent, vital signs notable for T102.4, heart rate up to 1:30, respiratory rate up to 49 breaths per minute. Patient currently on BiPAP and still with significant tachypnea. Blood work notable for WBC 1.3, lactic acid 4.29, K3.2, creatinine 1.27. Chest x-ray notable for multifocal patchy opacities favoring multifocal pneumonia. Patient started on vancomycin and cefepime. After discussion with emergency room physician, it was determined that patient would benefit from being admitted under critical care team due to high risk of clinical deterioration, requiring placement on life support.  Daughter at bedside explains that she would be okay with mother being placed on life support if the condition is treatable and temporary. Critical care was consulted. When [facility], please call our floor manager at [phone].  Debbora PrestoMAGICK-Gentri Guardado, MD  [facility] Pager [phone] Cell [phone]  If 7PM-7AM, please contact night-coverage [email or web address] Password [email or web address]

## 2017-08-17 NOTE — Progress Notes (Signed)
ABG changed to 19:00 due to pt not sedated. Pt bucking the vent. RN getting ready to start sedation.

## 2017-08-17 DEATH — deceased

## 2017-08-23 ENCOUNTER — Ambulatory Visit: Payer: Self-pay | Admitting: Neurology

## 2017-09-11 ENCOUNTER — Encounter: Payer: Self-pay | Admitting: Physician Assistant

## 2017-09-26 ENCOUNTER — Ambulatory Visit: Payer: Medicare Other | Admitting: Neurology
